# Patient Record
Sex: Female | Born: 1995 | Race: Black or African American | Hispanic: No | Marital: Married | Smoking: Former smoker
Health system: Southern US, Community
[De-identification: ages and names within clinical notes are randomized; demographics above are authoritative.]

## PROBLEM LIST (undated history)

## (undated) DIAGNOSIS — J302 Other seasonal allergic rhinitis: Secondary | ICD-10-CM

## (undated) DIAGNOSIS — L309 Dermatitis, unspecified: Secondary | ICD-10-CM

## (undated) DIAGNOSIS — F32A Depression, unspecified: Secondary | ICD-10-CM

## (undated) DIAGNOSIS — F419 Anxiety disorder, unspecified: Secondary | ICD-10-CM

## (undated) DIAGNOSIS — F329 Major depressive disorder, single episode, unspecified: Secondary | ICD-10-CM

## (undated) DIAGNOSIS — A6000 Herpesviral infection of urogenital system, unspecified: Secondary | ICD-10-CM

## (undated) DIAGNOSIS — D649 Anemia, unspecified: Secondary | ICD-10-CM

## (undated) HISTORY — PX: NO PAST SURGERIES: SHX2092

## (undated) NOTE — *Deleted (*Deleted)
History     CSN: [id number]  Arrival date and time: 07/31/20 1803   First Provider Initiated Contact with Patient 07/31/20 1938      Chief Complaint  Patient presents with  . Contractions  . Dizziness   24 y.o. G1 @29 .6 wks presenting with episode of dizzyness and lightheadedness. Episode happened this afternoon when she was sitting doing work from home. She had something to eat and drink but didn't feel better then began having sharp LAP. Pain was coming q3 min. Denies LOF. Reports some pink spotting when she wipes. No recent IC. She is currently treating an HSV outbreak. No urinary sx. Reports good FM.   OB History    Gravida  1   Para  0   Term  0   Preterm  0   AB  0   Living  0     SAB  0   TAB  0   Ectopic  0   Multiple  0   Live Births              Past Medical History:  Diagnosis Date  . Anemia   . Anxiety   . Asthma   . Depression    doing ok  . Eczema   . Genital herpes   . Seasonal allergies     Past Surgical History:  Procedure Laterality Date  . NO PAST SURGERIES      Family History  Adopted: Yes  Problem Relation Age of Onset  . Hypertension Father   . Hypertension Mother     Social History   Tobacco Use  . Smoking status: Former Smoker    Types: Cigars  . Smokeless tobacco: Never Used  . Tobacco comment: April 2017  Vaping Use  . Vaping Use: Never used  Substance Use Topics  . Alcohol use: Yes    Comment: Social   . Drug use: No    Allergies:  Allergies  Allergen Reactions  . Dust Mite Extract Itching  . Latex Swelling    Medications Prior to Admission  Medication Sig Dispense Refill Last Dose  . diphenhydrAMINE (BENADRYL) 25 mg capsule Take 25 mg by mouth 2 (two) times daily as needed for allergies.   07/30/2020 at Unknown time  . ferrous sulfate 325 (65 FE) MG tablet Take 325 mg by mouth daily with breakfast.   07/31/2020 at Unknown time  . valACYclovir (VALTREX) 1000 MG tablet Take 1,000 mg by mouth  daily.       Review of Systems  Gastrointestinal: Positive for abdominal pain.  Genitourinary: Positive for urgency and vaginal bleeding. Negative for dysuria, frequency and vaginal discharge.  Musculoskeletal: Positive for back pain.   Physical Exam   Blood pressure 113/74, pulse (!) 145, resp. rate 20, last menstrual period 01/04/2020, SpO2 96 %.  Physical Exam Vitals and nursing note reviewed. Exam conducted with a chaperone present.  Constitutional:      Appearance: Normal appearance.  HENT:     Head: Normocephalic and atraumatic.  Pulmonary:     Effort: Pulmonary effort is normal. No respiratory distress.  Musculoskeletal:        General: Normal range of motion.     Cervical back: Normal and normal range of motion.     Thoracic back: Normal.     Lumbar back: Bony tenderness present.  Skin:    General: Skin is warm and dry.  Neurological:     General: No focal deficit present.     Mental  Status: She is alert and oriented to person, place, and time.  Psychiatric:        Mood and Affect: Mood normal.        Behavior: Behavior normal.   EFM: 155 bpm, mod variability, + accels, no decels Toco: none  No results found for this or any previous visit (from the past 24 hour(s)).  MAU Course  Procedures  MDM Labs ordered. Transfer of care given to Lorenda Peck, [place]  07/31/2020 8:04 PM    Assessment and Plan

---
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
## 2014-01-05 ENCOUNTER — Encounter (HOSPITAL_COMMUNITY): Payer: Self-pay | Admitting: Emergency Medicine

## 2014-01-05 ENCOUNTER — Emergency Department (HOSPITAL_COMMUNITY)
Admission: EM | Admit: 2014-01-05 | Discharge: 2014-01-05 | Disposition: A | Discharge status: Home / Self Care | Payer: No Typology Code available for payment source | Attending: Emergency Medicine | Admitting: Emergency Medicine

## 2014-01-05 DIAGNOSIS — Z8659 Personal history of other mental and behavioral disorders: Secondary | ICD-10-CM | POA: Insufficient documentation

## 2014-01-05 DIAGNOSIS — Z79899 Other long term (current) drug therapy: Secondary | ICD-10-CM | POA: Insufficient documentation

## 2014-01-05 DIAGNOSIS — Z3202 Encounter for pregnancy test, result negative: Secondary | ICD-10-CM | POA: Insufficient documentation

## 2014-01-05 DIAGNOSIS — J45909 Unspecified asthma, uncomplicated: Secondary | ICD-10-CM | POA: Insufficient documentation

## 2014-01-05 DIAGNOSIS — IMO0002 Reserved for concepts with insufficient information to code with codable children: Secondary | ICD-10-CM

## 2014-01-05 DIAGNOSIS — T7421XA Adult sexual abuse, confirmed, initial encounter: Secondary | ICD-10-CM | POA: Insufficient documentation

## 2014-01-05 DIAGNOSIS — Z862 Personal history of diseases of the blood and blood-forming organs and certain disorders involving the immune mechanism: Secondary | ICD-10-CM | POA: Insufficient documentation

## 2014-01-05 MED ORDER — LEVONORGESTREL 0.75 MG PO TABS
ORAL_TABLET | ORAL | Status: AC
  Administered 2014-01-05: 1.5 mg via ORAL
  Filled 2014-01-05: qty 2

## 2014-01-05 MED ORDER — CEFIXIME 400 MG PO TABS
ORAL_TABLET | ORAL | Status: AC
  Administered 2014-01-05: 400 mg via ORAL
  Filled 2014-01-05: qty 1

## 2014-01-05 MED ORDER — METRONIDAZOLE 500 MG PO TABS
ORAL_TABLET | ORAL | Status: AC
  Administered 2014-01-05: 2000 mg via ORAL
  Filled 2014-01-05: qty 4

## 2014-01-05 MED ORDER — AZITHROMYCIN 1 G PO PACK
PACK | ORAL | Status: AC
  Administered 2014-01-05: 1 g via ORAL
  Filled 2014-01-05: qty 1

## 2014-01-05 MED ORDER — PROMETHAZINE HCL 25 MG PO TABS
ORAL_TABLET | ORAL | Status: AC
  Administered 2014-01-05: 25 mg via ORAL
  Filled 2014-01-05: qty 3
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
## 2014-01-05 NOTE — ED Notes (Signed)
Bed: WA29 Expected date:  Expected time:  Means of arrival:  Comments: TCU 

## 2014-01-05 NOTE — ED Notes (Signed)
Sane nurses at bedside.
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
## 2014-01-05 NOTE — Discharge Instructions (Signed)
° °Sexual Assault, Rape  °Sexual assault can be physical, verbal, visual, or anything that forces a person to have unwanted sexual contact. You are being sexually abused if you are forced to have sexual contact of any kind (vaginal, oral, or anal). Sexual assault is called rape if penetration has occurred. Sexual assault and rape are never the victim's fault.  °The physical dangers of rape include pregnancy, injury, and catching a sexually transmitted infection (STI). Your caregiver or emergency room doctor may recommend a number of tests to be done after a sexual assault or rape. A rape kit will collect evidence and check for infection and injury. You may be treated for an infection even if no signs of one are present. This may also be true if tests and cultures for disease are negative. Emergency contraceptive medications are also available to help prevent pregnancy, if this is desired. All of these options can be discussed with your caregiver. A sexual assault is a traumatic event. Counseling is available.  °STEPS TO TAKE IF A SEXUAL ASSAULT HAS HAPPENED  °Go to an area of safety as quickly as possible and call the police. This may include going to a shelter or staying with a friend. Stay away from the area where you have been attacked. Many times, sexual assaults are caused by a friend, relative, or associate.  °Do not wash, shower, comb your hair, or clean any part of your body.  °Do not change your clothes.  °Do not remove or touch anything in the area where you were assaulted.  °Go to an emergency room or your caregiver for a complete physical exam. Get the necessary tests to protect yourself from disease or pregnancy.  °If medications were given by your caregiver, take them as directed for the full length of time prescribed. If you have come in contact with a sexual infection, find out if you are to be tested again. If your caregiver is concerned about the HIV/AIDS virus, you may be required to have  continued testing for several months. Make sure you know how to get test results. It is your responsibility to get the results of all testing done.  °File the correct papers with the authorities. This is important for all assaults, even if they were done by a family member orfriend.  °Only take over-the-counter or prescription medicines for pain, discomfort, or fever as directed by your caregiver. °HOME CARE INSTRUCTIONS  °Carry mace or pepper spray for protection against an attacker.  °Do not try to fight off an attacker if he or she has a gun or knife.  °Be aware of your surroundings, what is happening around you, and who might be there.  °Be assertive, trust your instincts, and walk with confidence and direction.  °Always lock your doors and windows.  °Do not let anyone who you do not know enter your house.  °Get door safety restraints and always use them.  °Get a security system that has a siren if you are able.  °Protect your house and car keys. Do not lend them out. Do not put your name and address on them. If you lose them, get your locks changed.  °Always lock your car and have your key ready to open the door before approaching the car.  °Park in a well lit and busy area.  °Keep your car serviced. Always have at least half of a tank of gas in it.  ° °POSSIBLE TREATMENT: °·  You may have received oral or injectable   to help prevent sexually transmitted infections.  Hepatitis B vaccine- Immunization should be given if not already immunized.  This is a series of three injections, so any further injections should be obtained by your primary care physician.  HPV (Human Papilloma Virus) vaccine (Gardisil)- Immunization should be given, if not immunized previously or not up-to-date.  HIV (Human Immunodeficiency Virus)- Your caregiver will help you decide whether to begin the prophylactic medications, based on your circumstances.    Tetanus Immunization- This also may be recommended based on your  circumstances.  Pregnancy Prevention-  Also called "emergency contraception."  This will be offered to you if the situation indicates.  SUGGESTED FOLLOW-UP CARE:  Please follow-up with your medical care provider or where you/your child were referred (health department, women's clinic, pediatrician, etc) IN TEN DAYS TO TWO WEEKS.  We recommend the following during that visit, if indicated:  Pregnancy test  HIV, syphyllis, and Hepatitis blood testing  Cultures for sexually transmitted infections   Drive on lighted and frequently traveled streets.  Do not go into isolated areas like open garages, empty offices, buildings, or laundry rooms alone.  Do not walk or jog alone, especially when it is dark.  Never hitchhike!  If your car breaks down, call the police for help on your cell phone, put the hood of your car up, and a put a "HELP" sign on your front and back windows.  If you are being followed, go to a busy store or to someone's house and call for help.  If you are stopped by a police officer, especially in an unmarked police car, keep your door locked. Do not put your window down all the way. Ask them to show you identification first.  Beware of "date rape drugs" that can be placed in a drink when you are not looking and can make you unable to fight off an assault. They usually cause memory loss. If you know someone who has been sexually abused or raped, take them to a hospital or to the police or call your local emergency services for help.  SEEK MEDICAL CARE IF:  You have new problems because of your injuries.  You have problems that may be because of the medicine you are taking. These problems may include rash, itching, swelling, or trouble breathing.  You develop belly (abdominal) pain, you feel sick to your stomach (nausea), or you vomit.  You have an oral temperature above 102 F (38.9 C).  You need supportive care or referral to a rape crisis center. These are centers with  trained personnel who can help you get through this ordeal.  You have abnormal vaginal bleeding.  You have abnormal vaginal discharge. SEEK IMMEDIATE MEDICAL CARE IF:  You have been sexually assaulted or raped. Call your local emergency department (911 in the [place] for help.  You are afraid of being threatened, beaten, or abused. Call your local emergency department (911 in the [place] for help.  You receive new injuries related to abuse.  You have an oral temperature above 102 F (38.9 C), not controlled by medicine. For more information on sexual assault and rape call the National Women's Health Information Center at 1-800-994-9662.  Document Released: 08/27/2000 Document Revised: 11/22/2011 Document Reviewed: 10/10/2008  ExitCare Patient Information 2014 [employer].   
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
## 2014-01-07 LAB — POC URINE PREG, ED: PREG TEST UR: NEGATIVE

## 2014-01-26 ENCOUNTER — Encounter: Payer: Self-pay | Admitting: *Deleted

## 2014-02-07 ENCOUNTER — Encounter: Payer: Medicaid Other | Admitting: Family

## 2014-06-29 ENCOUNTER — Encounter (HOSPITAL_COMMUNITY): Payer: Self-pay | Admitting: Emergency Medicine

## 2014-06-29 ENCOUNTER — Inpatient Hospital Stay (HOSPITAL_COMMUNITY)
Admission: EM | Admit: 2014-06-29 | Discharge: 2014-07-03 | DRG: 812 | Disposition: A | Discharge status: Home / Self Care | Payer: Medicaid Other | Attending: Obstetrics and Gynecology | Admitting: Obstetrics and Gynecology

## 2014-06-29 ENCOUNTER — Emergency Department (HOSPITAL_COMMUNITY): Payer: Medicaid Other

## 2014-06-29 DIAGNOSIS — D500 Iron deficiency anemia secondary to blood loss (chronic): Principal | ICD-10-CM | POA: Diagnosis present

## 2014-06-29 DIAGNOSIS — N938 Other specified abnormal uterine and vaginal bleeding: Secondary | ICD-10-CM | POA: Diagnosis present

## 2014-06-29 DIAGNOSIS — R102 Pelvic and perineal pain: Secondary | ICD-10-CM

## 2014-06-29 DIAGNOSIS — R7401 Elevation of levels of liver transaminase levels: Secondary | ICD-10-CM

## 2014-06-29 DIAGNOSIS — N939 Abnormal uterine and vaginal bleeding, unspecified: Secondary | ICD-10-CM

## 2014-06-29 DIAGNOSIS — D649 Anemia, unspecified: Secondary | ICD-10-CM | POA: Diagnosis present

## 2014-06-29 DIAGNOSIS — R740 Nonspecific elevation of levels of transaminase and lactic acid dehydrogenase [LDH]: Secondary | ICD-10-CM

## 2014-06-29 DIAGNOSIS — B009 Herpesviral infection, unspecified: Secondary | ICD-10-CM

## 2014-06-29 DIAGNOSIS — A6000 Herpesviral infection of urogenital system, unspecified: Secondary | ICD-10-CM | POA: Diagnosis present

## 2014-06-29 LAB — CBC WITH DIFFERENTIAL/PLATELET
BASOS ABS: 0.1 10*3/uL (ref 0.0–0.1)
Basophils Relative: 1 % (ref 0–1)
Eosinophils Absolute: 0 10*3/uL (ref 0.0–0.7)
Eosinophils Relative: 0 % (ref 0–5)
HCT: 22.6 % — ABNORMAL LOW (ref 36.0–46.0)
Hemoglobin: 6.7 g/dL — CL (ref 12.0–15.0)
LYMPHS ABS: 2.1 10*3/uL (ref 0.7–4.0)
Lymphocytes Relative: 30 % (ref 12–46)
MCH: 19.6 pg — AB (ref 26.0–34.0)
MCHC: 29.6 g/dL — ABNORMAL LOW (ref 30.0–36.0)
MCV: 66.3 fL — AB (ref 78.0–100.0)
MONOS PCT: 16 % — AB (ref 3–12)
Monocytes Absolute: 1.1 10*3/uL — ABNORMAL HIGH (ref 0.1–1.0)
NEUTROS ABS: 3.8 10*3/uL (ref 1.7–7.7)
Neutrophils Relative %: 53 % (ref 43–77)
Platelets: 161 10*3/uL (ref 150–400)
RBC: 3.41 MIL/uL — ABNORMAL LOW (ref 3.87–5.11)
RDW: 18.2 % — AB (ref 11.5–15.5)
WBC: 7.1 10*3/uL (ref 4.0–10.5)

## 2014-06-29 LAB — COMPREHENSIVE METABOLIC PANEL
ALBUMIN: 2.9 g/dL — AB (ref 3.5–5.2)
ALT: 16 U/L (ref 0–35)
ANION GAP: 12 (ref 5–15)
AST: 69 U/L — ABNORMAL HIGH (ref 0–37)
Alkaline Phosphatase: 37 U/L — ABNORMAL LOW (ref 39–117)
BILIRUBIN TOTAL: 0.3 mg/dL (ref 0.3–1.2)
BUN: 7 mg/dL (ref 6–23)
CALCIUM: 7.8 mg/dL — AB (ref 8.4–10.5)
CHLORIDE: 103 meq/L (ref 96–112)
CO2: 24 mEq/L (ref 19–32)
CREATININE: 0.78 mg/dL (ref 0.50–1.10)
GFR calc Af Amer: >90 mL/min (ref >90)
GFR calc non Af Amer: >90 mL/min (ref >90)
Glucose, Bld: 86 mg/dL (ref 70–99)
Potassium: 3 mEq/L — ABNORMAL LOW (ref 3.7–5.3)
Sodium: 139 mEq/L (ref 137–147)
Total Protein: 6.8 g/dL (ref 6.0–8.3)

## 2014-06-29 LAB — URINALYSIS, ROUTINE W REFLEX MICROSCOPIC
BILIRUBIN URINE: NEGATIVE
Glucose, UA: NEGATIVE mg/dL
Ketones, ur: NEGATIVE mg/dL
Nitrite: NEGATIVE
Protein, ur: NEGATIVE mg/dL
SPECIFIC GRAVITY, URINE: 1.004 — AB (ref 1.005–1.030)
UROBILINOGEN UA: 1 mg/dL (ref 0.0–1.0)
pH: 6.5 (ref 5.0–8.0)

## 2014-06-29 LAB — TYPE AND SCREEN
ABO/RH(D): O POS
Antibody Screen: NEGATIVE

## 2014-06-29 LAB — URINE MICROSCOPIC-ADD ON

## 2014-06-29 LAB — WET PREP, GENITAL
Clue Cells Wet Prep HPF POC: NONE SEEN
Trich, Wet Prep: NONE SEEN
Yeast Wet Prep HPF POC: NONE SEEN

## 2014-06-29 LAB — RETICULOCYTES: RBC.: 3.39 MIL/uL — AB (ref 3.87–5.11)

## 2014-06-29 LAB — POC URINE PREG, ED: Preg Test, Ur: NEGATIVE

## 2014-06-29 MED ORDER — POTASSIUM CHLORIDE 10 MEQ/100ML IV SOLN
10.0000 meq | Freq: Once | INTRAVENOUS | Status: AC
  Administered 2014-06-29: 10 meq via INTRAVENOUS
  Filled 2014-06-29: qty 100

## 2014-06-29 MED ORDER — ONDANSETRON HCL 4 MG/2ML IJ SOLN
4.0000 mg | Freq: Once | INTRAMUSCULAR | Status: AC
  Administered 2014-06-29: 4 mg via INTRAVENOUS
  Filled 2014-06-29: qty 2

## 2014-06-29 MED ORDER — DEXTROSE 5 % IV SOLN: 1.0000 g | Freq: Once | INTRAVENOUS | Status: DC

## 2014-06-29 MED ORDER — SODIUM CHLORIDE 0.9 % IV BOLUS (SEPSIS)
1000.0000 mL | Freq: Once | INTRAVENOUS | Status: AC
  Administered 2014-06-29: 1000 mL via INTRAVENOUS

## 2014-06-29 MED ORDER — MORPHINE SULFATE 4 MG/ML IJ SOLN
4.0000 mg | Freq: Once | INTRAMUSCULAR | Status: AC
  Administered 2014-06-29: 4 mg via INTRAVENOUS
  Filled 2014-06-29: qty 1

## 2014-06-29 NOTE — ED Notes (Signed)
US at bedside

## 2014-06-29 NOTE — ED Provider Notes (Signed)
Medical screening examination/treatment/procedure(s) were conducted as a shared visit with non-physician practitioner(s) and myself.  I personally evaluated the patient during the encounter.   EKG Interpretation None     The patient reports that she has vaginal discomfort. She has been noting blood in her urine. Patient denies that she's had any lightheadedness dizziness or near syncope  Patient is alert and nontoxic. Her abdomen soft nontender.  At this time patient is found to be significantly anemic and plan is in place for transfer to the [facility] hospital for transfusion and further evaluation. I am in agreement with the plan.  Charlesetta Shanks, MD 06/29/14 2149

## 2014-06-29 NOTE — ED Provider Notes (Signed)
CSN: [id number]     Arrival date & time 06/29/14  1721 History   First MD Initiated Contact with Patient 06/29/14 1758     Chief Complaint  Patient presents with  . Groin Swelling  . Vaginal Discharge     (Consider location/radiation/quality/duration/timing/severity/associated sxs/prior Treatment) HPI Comments: Brandy Howard is a 18 y.o. G0P0 female with a PMHx of asthma, iron deficiency anemia, anxiety, and seasonal allergies, who presents to the ED with complaints of vaginal bleeding/urethral bleeding (pt unsure if blood coming from vagina or only from urine) and vaginal pain with labial swelling/lesions x4 days. Pt states on Monday she developed flu-like symptoms, including decreased appetite, nausea with food although no emesis and tolerating fluids, HA, body aches and generalized fatigue and malaise. Tuesday she developed labial swelling with "flat white patches". Pain is 7/10 constant pressure-like located in the labia, nonradiating, worse with urination, and unimproved with motrin. On Thursday she developed dysuria and hematuria stating she began passing "large clots" only in her urine and denies that this was coming from her vagina. She states that on Tuesday during her shower she had a syncopal episode, but did not have any head injury. Has not had any further syncopal episodes since. She endorses white vaginal discharge that smells of blood. Denies fevers, chills, ongoing dizziness, lightheadedness, CP, SOB, URI symptoms, cough, abd pain, vomiting, diarrhea, constipation, dyspareunia, vaginal itching, new soaps or detergents, arthralgias, ongoing syncopal episodes, weakness, or any other concerns. States she's sexually active with 8 sexual partners this year, last intercourse was one week ago and was unprotected, and states it was "rough". LMP 06/08/14 States she has regular periods but they are 5 days long and heavy. Was told she needed to take iron for anemia 2 yrs ago and never did.    Patient is a 18 y.o. female presenting with vaginal bleeding. The history is provided by the patient. No language interpreter was used.  Vaginal Bleeding Quality:  Clots and dark red Severity:  Moderate Onset quality:  Gradual Duration:  4 days Timing:  Constant Progression:  Unchanged Chronicity:  New Menstrual history:  Regular Possible pregnancy: no   Context: after urination   Relieved by:  None tried Worsened by:  Urination Ineffective treatments:  Ibuprofen Associated symptoms: dizziness, dysuria, fatigue, nausea and vaginal discharge   Associated symptoms: no abdominal pain, no back pain, no dyspareunia and no fever   Vaginal discharge:    Quality:  White   Severity:  Moderate   Onset quality:  Gradual   Duration:  4 days   Timing:  Constant   Progression:  Unchanged   Chronicity:  New Risk factors: unprotected sex   Risk factors: no bleeding disorder, no hx of ectopic pregnancy and no gynecological surgery     Past Medical History  Diagnosis Date  . Asthma   . Anemia   . Anxiety   . Seasonal allergies    History reviewed. No pertinent past surgical history. History reviewed. No pertinent family history. History  Substance Use Topics  . Smoking status: Never Smoker   . Smokeless tobacco: Never Used  . Alcohol Use: No   OB History   Grav Para Term Preterm Abortions TAB SAB Ect Mult Living                 Review of Systems  Constitutional: Positive for appetite change (decreased) and fatigue. Negative for fever and chills.  HENT: Negative for congestion, rhinorrhea and sore throat.   Eyes: Negative  for photophobia, pain, redness and visual disturbance.  Respiratory: Negative for cough and shortness of breath.   Cardiovascular: Negative for chest pain.  Gastrointestinal: Positive for nausea. Negative for vomiting, abdominal pain, diarrhea, constipation, blood in stool, abdominal distention and rectal pain.  Genitourinary: Positive for dysuria,  hematuria, vaginal bleeding, vaginal discharge, vaginal pain and pelvic pain. Negative for urgency, flank pain, decreased urine volume, difficulty urinating and dyspareunia.  Musculoskeletal: Positive for myalgias (generalized body aches). Negative for arthralgias and back pain.  Skin: Negative for rash.  Allergic/Immunologic: Negative for immunocompromised state.  Neurological: Positive for dizziness. Negative for syncope, weakness, light-headedness, numbness and headaches.  Hematological: Negative for adenopathy.  Psychiatric/Behavioral: Negative for confusion.    10 Systems reviewed and are negative for acute change except as noted in the HPI.   Allergies  Dust mite extract  Home Medications   Prior to Admission medications   Not on File   BP 115/68  Pulse 100  Temp(Src) 98.7 F (37.1 C) (Oral)  Resp 18  SpO2 100%  LMP 06/08/2014 Physical Exam  Nursing note and vitals reviewed. Constitutional: She is oriented to person, place, and time. Vital signs are normal. She appears well-developed and well-nourished. No distress.  Afebrile, nontoxic, NAD, talking on her cell phone, appears somewhat uncomfortable when sitting up but otherwise NAD  HENT:  Head: Normocephalic and atraumatic.  Mouth/Throat: Mucous membranes are normal.  Eyes: Conjunctivae and EOM are normal. Pupils are equal, round, and reactive to light. Right eye exhibits no discharge. Left eye exhibits no discharge.  Conjunctival pallor  Neck: Normal range of motion. Neck supple.  Cardiovascular: Normal rate, regular rhythm, normal heart sounds and intact distal pulses.  Exam reveals no gallop and no friction rub.   No murmur heard. RRR, nl s1/s2, no m/r/g  Pulmonary/Chest: Effort normal and breath sounds normal. No respiratory distress. She has no decreased breath sounds. She has no wheezes. She has no rhonchi. She has no rales.  Abdominal: Soft. Normal appearance and bowel sounds are normal. She exhibits no  distension. There is no tenderness. There is no rigidity, no rebound, no guarding, no CVA tenderness, no tenderness at McBurney's point and negative Stainback's sign.  Soft, NTND, +BS throughout, no r/g/r, neg murphys, neg mcburney's, no CVA TTP  Genitourinary: Pelvic exam was performed with patient supine. There is rash, tenderness and lesion on the right labia. There is no injury on the right labia. There is rash, tenderness and lesion on the left labia. There is no injury on the left labia. Uterus is tender. Uterus is not deviated, not enlarged and not fixed. Cervix exhibits motion tenderness and discharge (bloody). Cervix exhibits no friability. Right adnexum displays tenderness. Right adnexum displays no mass and no fullness. Left adnexum displays tenderness. Left adnexum displays no mass and no fullness. There is bleeding around the vagina. No erythema or tenderness around the vagina. No foreign body around the vagina. No signs of injury around the vagina. No vaginal discharge found.  Diffuse erythematous somewhat vesicular rash of bilateral labia with tenderness of lesions, no bartholin's gland abscesses. No erythema, injury, or tenderness to vaginal mucosa. Moderate amount of dark red blood within vaginal vault coming from cervical os which is closed. No adnexal masses or fullness but diffusely tender in b/l adnexa and to uterus. +CMT but difficult to assess whether this is just tenderness of the uterus or true CMT. No cervical friability or discharge from cervical os, but dark red blood coming from closed os. Uterus  non-deviated, mobile, and without enlargement, with diffuse tenderness. Urethra without trauma or lesions, no erythema or bleeding.  Musculoskeletal: Normal range of motion.  Neurological: She is alert and oriented to person, place, and time. She has normal strength. No sensory deficit. Gait normal.  Skin: Skin is warm, dry and intact. No rash noted.  Psychiatric: She has a normal mood and  affect.    ED Course  Procedures (including critical care time) Labs Review Labs Reviewed  WET PREP, GENITAL - Abnormal; Notable for the following:    WBC, Wet Prep HPF POC FEW (*)    All other components within normal limits  CBC WITH DIFFERENTIAL - Abnormal; Notable for the following:    RBC 3.41 (*)    Hemoglobin 6.7 (*)    HCT 22.6 (*)    MCV 66.3 (*)    MCH 19.6 (*)    MCHC 29.6 (*)    RDW 18.2 (*)    Monocytes Relative 16 (*)    Monocytes Absolute 1.1 (*)    All other components within normal limits  COMPREHENSIVE METABOLIC PANEL - Abnormal; Notable for the following:    Potassium 3.0 (*)    Calcium 7.8 (*)    Albumin 2.9 (*)    AST 69 (*)    Alkaline Phosphatase 37 (*)    All other components within normal limits  RETICULOCYTES - Abnormal; Notable for the following:    Retic Ct Pct <0.4 (*)    RBC. 3.39 (*)    All other components within normal limits  GC/CHLAMYDIA PROBE AMP  URINALYSIS, ROUTINE W REFLEX MICROSCOPIC  RPR  HIV ANTIBODY (ROUTINE TESTING)  VITAMIN B12  FOLATE  IRON AND TIBC  FERRITIN  POC URINE PREG, ED  TYPE AND SCREEN  ABO/RH    Imaging Review US Transvaginal Non-ob  06/29/2014   CLINICAL DATA:  Vaginal bleeding. Tender uterus and adnexa bilaterally.  EXAM: TRANSABDOMINAL AND TRANSVAGINAL ULTRASOUND OF PELVIS  DOPPLER ULTRASOUND OF OVARIES  TECHNIQUE: Both transabdominal and transvaginal ultrasound examinations of the pelvis were performed. Transabdominal technique was performed for global imaging of the pelvis including uterus, ovaries, adnexal regions, and pelvic cul-de-sac.  It was necessary to proceed with endovaginal exam following the transabdominal exam to visualize the ovaries. Color and duplex Doppler ultrasound was utilized to evaluate blood flow to the ovaries.  COMPARISON:  None.  FINDINGS: Uterus  Measurements: 6.5 x 3.7 x 3.3 cm. No fibroids or other mass visualized.  Endometrium  Thickness: 7.5 mm.  No focal abnormality visualized.   Right ovary  Measurements: 3.1 x 2.8 x 2.3 cm. Normal appearance/no adnexal mass.  Left ovary  Measurements: 2.8 x 1.5 x 1.2 cm. Normal appearance/no adnexal mass.  Pulsed Doppler evaluation of both ovaries demonstrates normal low-resistance arterial and venous waveforms.  Other findings  Trace amount of free peritoneal fluid, within normal limits of physiological fluid.  IMPRESSION: Normal examination.   Electronically Signed   By: Enrique Sack M.D.   On: 06/29/2014 20:51   US Pelvis Complete  06/29/2014   CLINICAL DATA:  Vaginal bleeding. Tender uterus and adnexa bilaterally.  EXAM: TRANSABDOMINAL AND TRANSVAGINAL ULTRASOUND OF PELVIS  DOPPLER ULTRASOUND OF OVARIES  TECHNIQUE: Both transabdominal and transvaginal ultrasound examinations of the pelvis were performed. Transabdominal technique was performed for global imaging of the pelvis including uterus, ovaries, adnexal regions, and pelvic cul-de-sac.  It was necessary to proceed with endovaginal exam following the transabdominal exam to visualize the ovaries. Color and duplex Doppler ultrasound was  utilized to evaluate blood flow to the ovaries.  COMPARISON:  None.  FINDINGS: Uterus  Measurements: 6.5 x 3.7 x 3.3 cm. No fibroids or other mass visualized.  Endometrium  Thickness: 7.5 mm.  No focal abnormality visualized.  Right ovary  Measurements: 3.1 x 2.8 x 2.3 cm. Normal appearance/no adnexal mass.  Left ovary  Measurements: 2.8 x 1.5 x 1.2 cm. Normal appearance/no adnexal mass.  Pulsed Doppler evaluation of both ovaries demonstrates normal low-resistance arterial and venous waveforms.  Other findings  Trace amount of free peritoneal fluid, within normal limits of physiological fluid.  IMPRESSION: Normal examination.   Electronically Signed   By: Enrique Sack M.D.   On: 06/29/2014 20:51   [place] Art/ven Flow Abd Pelv Doppler  06/29/2014   CLINICAL DATA:  Vaginal bleeding. Tender uterus and adnexa bilaterally.  EXAM: TRANSABDOMINAL AND TRANSVAGINAL  ULTRASOUND OF PELVIS  DOPPLER ULTRASOUND OF OVARIES  TECHNIQUE: Both transabdominal and transvaginal ultrasound examinations of the pelvis were performed. Transabdominal technique was performed for global imaging of the pelvis including uterus, ovaries, adnexal regions, and pelvic cul-de-sac.  It was necessary to proceed with endovaginal exam following the transabdominal exam to visualize the ovaries. Color and duplex Doppler ultrasound was utilized to evaluate blood flow to the ovaries.  COMPARISON:  None.  FINDINGS: Uterus  Measurements: 6.5 x 3.7 x 3.3 cm. No fibroids or other mass visualized.  Endometrium  Thickness: 7.5 mm.  No focal abnormality visualized.  Right ovary  Measurements: 3.1 x 2.8 x 2.3 cm. Normal appearance/no adnexal mass.  Left ovary  Measurements: 2.8 x 1.5 x 1.2 cm. Normal appearance/no adnexal mass.  Pulsed Doppler evaluation of both ovaries demonstrates normal low-resistance arterial and venous waveforms.  Other findings  Trace amount of free peritoneal fluid, within normal limits of physiological fluid.  IMPRESSION: Normal examination.   Electronically Signed   By: Enrique Sack M.D.   On: 06/29/2014 20:51   US Abdomen Limited Ruq  06/29/2014   CLINICAL DATA:  Elevated liver enzymes  EXAM: US ABDOMEN LIMITED - RIGHT UPPER QUADRANT  COMPARISON:  None.  FINDINGS: Gallbladder:  No gallstones or wall thickening visualized. No sonographic Molnar sign noted.  Common bile duct:  Diameter: 4 mm  Liver:  No focal lesion identified. Within normal limits in parenchymal echogenicity.  IMPRESSION: Normal right upper quadrant ultrasound   Electronically Signed   By: Skipper Cliche M.D.   On: 06/29/2014 23:01     EKG Interpretation None      MDM   Final diagnoses:  Transaminitis  Vaginal bleeding  Iron deficiency anemia due to chronic blood loss    18y/o female with vaginal pain and lesions as well as bleeding that she states is from her urine but during pelvic, large amount of dark  red blood in vaginal vault and no urethral trauma or injury noted. Diffusely tender during exam, although this could just be from uterine bleeding, some concern for PID or TOA or torsion vs other pelvic etiology. Will obtain labs, give pain meds and obtain u/s. Will reassess shortly. Unclear if pt is pregnant, LMP less than one month ago, unable to obtain urine thus far, will I&O and hope to get upreg shortly.  8:35 PM Critical low H/H, appears to be iron deficiency anemia, ordered type and screen and anemia panel. Still awaiting Upreg, ultrasound in performing exam on her currently. Plan to consult for admission for transfusion. CMP with mildly elev AST and alk phos, likely related to dehydration/anemia, doubt  hepatic/biliary etiology. Wet prep showing few WBC, doubt need for empiric tx for PID or GC/CT, given that pt needs admission for anemia will defer for now on treatment. No vaginal discharge noted on exam, and other than bleeding no concern for infection. Hypokalemia noted, will plan to treat once admission consult placed, once we have a clear idea if she's pregnant or not.   9:31 PM Dr. Maudie Mercury returning page, would like OBGYN consulted. Would also like RUQ ultrasound obtained. Called OBGYN who agreed to accept pt, will begin transfer now.  11:13 PM Pt has bed assignment at [facility], Room 304, and to be transferred via [facility]. Given delays, started potassium now. U/A resulted, showed 3-6 WBC and small leuks, will give IV rocephin for possible UTI while pt awaiting transfer. Will culture. Would probably do fine on PO abx as outpt for UTI, but pt awaiting transfer therefore will give IV dose here. RUQ ultrasound performed and negative for any abnormalities. Pt remains stable at this time.  Patty Sermons [place], [place] 06/30/14 0120

## 2014-06-29 NOTE — ED Notes (Signed)
In and out cath attempted from urine, unable to obtain urine sample at this time because pt urinated in bathroom prior to in and out cath.

## 2014-06-29 NOTE — ED Notes (Signed)
Bloody UA sample

## 2014-06-29 NOTE — ED Notes (Signed)
Pt states she just went to bathroom, requests more fluids before doing I&O cath

## 2014-06-29 NOTE — ED Notes (Signed)
Pt states that she began having vaginal swelling and discharge after 2 rough sexual encounters over the past week.  (One last Saturday and one the Saturday before).  States that her labia majora are swollen and that she has "flat, white patches" and vaginal discharge.  Also states that she has been having dysuria and bloody urine.

## 2014-06-30 ENCOUNTER — Encounter (HOSPITAL_COMMUNITY): Payer: Self-pay | Admitting: *Deleted

## 2014-06-30 DIAGNOSIS — N9089 Other specified noninflammatory disorders of vulva and perineum: Secondary | ICD-10-CM | POA: Diagnosis present

## 2014-06-30 DIAGNOSIS — A6000 Herpesviral infection of urogenital system, unspecified: Secondary | ICD-10-CM | POA: Diagnosis present

## 2014-06-30 DIAGNOSIS — D500 Iron deficiency anemia secondary to blood loss (chronic): Secondary | ICD-10-CM | POA: Diagnosis present

## 2014-06-30 DIAGNOSIS — D649 Anemia, unspecified: Secondary | ICD-10-CM | POA: Diagnosis present

## 2014-06-30 DIAGNOSIS — N938 Other specified abnormal uterine and vaginal bleeding: Secondary | ICD-10-CM | POA: Diagnosis present

## 2014-06-30 LAB — ABO/RH
ABO/RH(D): O POS
ABO/RH(D): O POS

## 2014-06-30 LAB — FOLATE

## 2014-06-30 LAB — IRON AND TIBC
Iron: 10 ug/dL — ABNORMAL LOW (ref 42–135)
Saturation Ratios: 3 % — ABNORMAL LOW (ref 20–55)
TIBC: 335 ug/dL (ref 250–470)
UIBC: 325 ug/dL (ref 125–400)

## 2014-06-30 LAB — FERRITIN: Ferritin: 32 ng/mL (ref 10–291)

## 2014-06-30 LAB — PREPARE RBC (CROSSMATCH)

## 2014-06-30 LAB — HIV ANTIBODY (ROUTINE TESTING W REFLEX): HIV: NONREACTIVE

## 2014-06-30 LAB — VITAMIN B12: VITAMIN B 12: 615 pg/mL (ref 211–911)

## 2014-06-30 LAB — RPR

## 2014-06-30 MED ORDER — ACETAMINOPHEN 325 MG PO TABS
650.0000 mg | ORAL_TABLET | Freq: Once | ORAL | Status: AC
  Administered 2014-06-30: 650 mg via ORAL
  Filled 2014-06-30: qty 2

## 2014-06-30 MED ORDER — IBUPROFEN 600 MG PO TABS
600.0000 mg | ORAL_TABLET | Freq: Four times a day (QID) | ORAL | Status: DC | PRN
  Administered 2014-06-30 – 2014-07-03 (×4): 600 mg via ORAL
  Filled 2014-06-30 (×4): qty 1

## 2014-06-30 MED ORDER — SODIUM CHLORIDE 0.9 % IV SOLN
Freq: Once | INTRAVENOUS | Status: AC
  Administered 2014-06-30: 04:00:00 via INTRAVENOUS

## 2014-06-30 MED ORDER — PRENATAL MULTIVITAMIN CH
1.0000 | ORAL_TABLET | Freq: Every day | ORAL | Status: DC
  Administered 2014-07-01 – 2014-07-02 (×2): 1 via ORAL
  Filled 2014-06-30 (×2): qty 1

## 2014-06-30 MED ORDER — LIDOCAINE HCL 2 % EX GEL
1.0000 "application " | Freq: Two times a day (BID) | CUTANEOUS | Status: DC
  Administered 2014-06-30: 5 via TOPICAL
  Filled 2014-06-30 (×7): qty 5

## 2014-06-30 MED ORDER — DEXTROSE 5 % IV SOLN
5.0000 mg/kg | Freq: Three times a day (TID) | INTRAVENOUS | Status: DC
  Administered 2014-06-30 – 2014-07-03 (×10): 250 mg via INTRAVENOUS
  Filled 2014-06-30 (×12): qty 5

## 2014-06-30 MED ORDER — LACTATED RINGERS IV SOLN
INTRAVENOUS | Status: DC
  Administered 2014-06-30 – 2014-07-02 (×3): via INTRAVENOUS

## 2014-06-30 MED ORDER — LACTATED RINGERS IV SOLN: INTRAVENOUS | Status: DC

## 2014-06-30 MED ORDER — NORETHIN-ETH ESTRADIOL-FE 0.4-35 MG-MCG PO CHEW
3.0000 | CHEWABLE_TABLET | Freq: Every day | ORAL | Status: AC
  Administered 2014-06-30 – 2014-07-02 (×3): 3 via ORAL
  Filled 2014-06-30 (×4): qty 3

## 2014-06-30 MED ORDER — OXYCODONE-ACETAMINOPHEN 5-325 MG PO TABS
1.0000 | ORAL_TABLET | ORAL | Status: DC | PRN
  Administered 2014-06-30 – 2014-07-01 (×3): 1 via ORAL
  Filled 2014-06-30 (×3): qty 1

## 2014-06-30 MED ORDER — DIPHENHYDRAMINE HCL 25 MG PO CAPS
25.0000 mg | ORAL_CAPSULE | Freq: Once | ORAL | Status: AC
  Administered 2014-06-30: 25 mg via ORAL
  Filled 2014-06-30: qty 1

## 2014-06-30 MED ORDER — NORETHIN-ETH ESTRADIOL-FE 0.4-35 MG-MCG PO CHEW
2.0000 | CHEWABLE_TABLET | Freq: Every day | ORAL | Status: DC
  Administered 2014-07-03: 2 via ORAL
  Filled 2014-06-30: qty 2

## 2014-06-30 NOTE — Plan of Care (Signed)
Problem: Phase I Progression Outcomes Goal: Pain controlled with appropriate interventions Outcome: Completed/Met Date Met:  06/30/14 Pain is better when patient sits still.Movement in uncomfortable.Xylocaine gel was added to vaginal area topically to see if this would help.

## 2014-06-30 NOTE — Progress Notes (Signed)
Subjective: Patient reports tolerating PO.  Pt c/o pain with voiding. She reports that the lidocaine burns too much and she doesn't want to continue it. She has not begun pericare as yet.  She reports that she was going to work with these lesions last year and she wants to be discharged if possible in the am.   Objective: I have reviewed patient's vital signs, intake and output, medications, labs and microbiology.  General: alert and no distress Resp: clear to auscultation bilaterally GI: soft, non-tender; bowel sounds normal; no masses,  no organomegaly Extremities: extremities normal, atraumatic, no cyanosis or edema Vaginal Bleeding: minimal GU: extensive lesions of the vulva   CBC    Component Value Date/Time   WBC 7.1 06/29/2014 1950   RBC 3.39* 06/29/2014 2104   RBC 3.41* 06/29/2014 1950   HGB 6.7* 06/29/2014 1950   HCT 22.6* 06/29/2014 1950   PLT 161 06/29/2014 1950   MCV 66.3* 06/29/2014 1950   MCH 19.6* 06/29/2014 1950   MCHC 29.6* 06/29/2014 1950   RDW 18.2* 06/29/2014 1950   LYMPHSABS 2.1 06/29/2014 1950   MONOABS 1.1* 06/29/2014 1950   EOSABS 0.0 06/29/2014 1950   BASOSABS 0.1 06/29/2014 1950      Assessment/Plan: 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

## 2014-06-30 NOTE — H&P (Signed)
Brandy Howard is an 18 y.o. female presented to ED for the evaluation of symptomatic anemia and vulva swelling. Patient reports having an un witnessed syncopal episode on Tuesday. She reports the development of vulva swelling on Monday and the appearance of white plaques on Tuesday which are very tender and makes urination painful. Patient states that she has never experienced such a thing in the past. Patient reports experiencing heavier vaginal bleeding with passage of clots. She reports a history of anemia and had not been taken the recommended iron supplement. Patient is sexually active without contraception. She has had a total of 8 different sexual partners this year.  Pertinent Gynecological History: Menses: flow is moderate Bleeding: monthly Contraception: none DES exposure: denies Blood transfusions: none Sexually transmitted diseases: currently at risk Previous GYN Procedures: none  Last mammogram: n/a  Last pap: n/a  OB History: G0, P0   Menstrual History: Patient's last menstrual period was 06/08/2014.    Past Medical History  Diagnosis Date  . Asthma   . Anemia   . Anxiety   . Seasonal allergies     History reviewed. No pertinent past surgical history.  History reviewed. No pertinent family history.  Social History:  reports that she has never smoked. She has never used smokeless tobacco. She reports that she does not drink alcohol or use illicit drugs.  Allergies:  Allergies  Allergen Reactions  . Dust Mite Extract Itching    No prescriptions prior to admission    Review of Systems  All other systems reviewed and are negative.   Blood pressure 109/58, pulse 95, temperature 97.9 F (36.6 C), temperature source Oral, resp. rate 16, height 5\' 3"  (1.6 m), weight 111 lb (50.349 kg), last menstrual period 06/08/2014, SpO2 100.00%. Physical Exam GENERAL: Well-developed, well-nourished female in no acute distress.  HEENT: Normocephalic, atraumatic. Sclerae  anicteric.  LUNGS: Clear to auscultation bilaterally.  HEART: Regular rate and rhythm. ABDOMEN: Soft, nontender, nondistended. No organomegaly. PELVIC: Bilateral labia swelling with extensive herpetic-like lesions on both labia extending towards the urethra and clitoral hood. Vagina is pink and rugated.  Normal discharge. Normal appearing cervix. Uterus is normal in size. No adnexal mass or tenderness. EXTREMITIES: No cyanosis, clubbing, or edema, 2+ distal pulses.  Results for orders placed during the hospital encounter of 06/29/14 (from the past 24 hour(s))  WET PREP, GENITAL     Status: Abnormal   Collection Time    06/29/14  7:47 PM      Result Value Ref Range   Yeast Wet Prep HPF POC NONE SEEN  NONE SEEN   Trich, Wet Prep NONE SEEN  NONE SEEN   Clue Cells Wet Prep HPF POC NONE SEEN  NONE SEEN   WBC, Wet Prep HPF POC FEW (*) NONE SEEN  CBC WITH DIFFERENTIAL     Status: Abnormal   Collection Time    06/29/14  7:50 PM      Result Value Ref Range   WBC 7.1  4.0 - 10.5 K/uL   RBC 3.41 (*) 3.87 - 5.11 MIL/uL   Hemoglobin 6.7 (*) 12.0 - 15.0 g/dL   HCT 22.6 (*) 36.0 - 46.0 %   MCV 66.3 (*) 78.0 - 100.0 fL   MCH 19.6 (*) 26.0 - 34.0 pg   MCHC 29.6 (*) 30.0 - 36.0 g/dL   RDW 18.2 (*) 11.5 - 15.5 %   Platelets 161  150 - 400 K/uL   Neutrophils Relative % 53  43 - 77 %  Lymphocytes Relative 30  12 - 46 %   Monocytes Relative 16 (*) 3 - 12 %   Eosinophils Relative 0  0 - 5 %   Basophils Relative 1  0 - 1 %   Neutro Abs 3.8  1.7 - 7.7 K/uL   Lymphs Abs 2.1  0.7 - 4.0 K/uL   Monocytes Absolute 1.1 (*) 0.1 - 1.0 K/uL   Eosinophils Absolute 0.0  0.0 - 0.7 K/uL   Basophils Absolute 0.1  0.0 - 0.1 K/uL   RBC Morphology ELLIPTOCYTES    COMPREHENSIVE METABOLIC PANEL     Status: Abnormal   Collection Time    06/29/14  7:50 PM      Result Value Ref Range   Sodium 139  137 - 147 mEq/L   Potassium 3.0 (*) 3.7 - 5.3 mEq/L   Chloride 103  96 - 112 mEq/L   CO2 24  19 - 32 mEq/L   Glucose,  Bld 86  70 - 99 mg/dL   BUN 7  6 - 23 mg/dL   Creatinine, Ser 0.78  0.50 - 1.10 mg/dL   Calcium 7.8 (*) 8.4 - 10.5 mg/dL   Total Protein 6.8  6.0 - 8.3 g/dL   Albumin 2.9 (*) 3.5 - 5.2 g/dL   AST 69 (*) 0 - 37 U/L   ALT 16  0 - 35 U/L   Alkaline Phosphatase 37 (*) 39 - 117 U/L   Total Bilirubin 0.3  0.3 - 1.2 mg/dL   GFR calc non Af Amer >90  >90 mL/min   GFR calc Af Amer >90  >90 mL/min   Anion gap 12  5 - 15  RPR     Status: None   Collection Time    06/29/14  7:50 PM      Result Value Ref Range   RPR NON REAC  NON REAC  HIV ANTIBODY (ROUTINE TESTING)     Status: None   Collection Time    06/29/14  7:50 PM      Result Value Ref Range   HIV 1&2 Ab, 4th Generation NONREACTIVE  NONREACTIVE  POC URINE PREG, ED     Status: None   Collection Time    06/29/14  9:02 PM      Result Value Ref Range   Preg Test, Ur NEGATIVE  NEGATIVE  TYPE AND SCREEN     Status: None   Collection Time    06/29/14  9:04 PM      Result Value Ref Range   ABO/RH(D) O POS     Antibody Screen NEG     Sample Expiration 07/02/2014    VITAMIN B12     Status: None   Collection Time    06/29/14  9:04 PM      Result Value Ref Range   Vitamin B-12 615  211 - 911 pg/mL  FOLATE     Status: None   Collection Time    06/29/14  9:04 PM      Result Value Ref Range   Folate >20.0    IRON AND TIBC     Status: Abnormal   Collection Time    06/29/14  9:04 PM      Result Value Ref Range   Iron 10 (*) 42 - 135 ug/dL   TIBC 335  250 - 470 ug/dL   Saturation Ratios 3 (*) 20 - 55 %   UIBC 325  125 - 400 ug/dL  FERRITIN  Status: None   Collection Time    06/29/14  9:04 PM      Result Value Ref Range   Ferritin 32  10 - 291 ng/mL  RETICULOCYTES     Status: Abnormal   Collection Time    06/29/14  9:04 PM      Result Value Ref Range   Retic Ct Pct <0.4 (*) 0.4 - 3.1 %   RBC. 3.39 (*) 3.87 - 5.11 MIL/uL   Retic Count, Manual NOT CALCULATED  19.0 - 186.0 K/uL  ABO/RH     Status: None   Collection Time     06/29/14  9:04 PM      Result Value Ref Range   ABO/RH(D) O POS    URINALYSIS, ROUTINE W REFLEX MICROSCOPIC     Status: Abnormal   Collection Time    06/29/14 10:14 PM      Result Value Ref Range   Color, Urine YELLOW  YELLOW   APPearance CLEAR  CLEAR   Specific Gravity, Urine 1.004 (*) 1.005 - 1.030   pH 6.5  5.0 - 8.0   Glucose, UA NEGATIVE  NEGATIVE mg/dL   Hgb urine dipstick TRACE (*) NEGATIVE   Bilirubin Urine NEGATIVE  NEGATIVE   Ketones, ur NEGATIVE  NEGATIVE mg/dL   Protein, ur NEGATIVE  NEGATIVE mg/dL   Urobilinogen, UA 1.0  0.0 - 1.0 mg/dL   Nitrite NEGATIVE  NEGATIVE   Leukocytes, UA SMALL (*) NEGATIVE  URINE MICROSCOPIC-ADD ON     Status: None   Collection Time    06/29/14 10:14 PM      Result Value Ref Range   WBC, UA 3-6  <3 WBC/hpf   RBC / HPF 0-2  <3 RBC/hpf  PREPARE RBC (CROSSMATCH)     Status: None   Collection Time    06/30/14  2:29 AM      Result Value Ref Range   Order Confirmation ORDER PROCESSED BY BLOOD BANK    TYPE AND SCREEN     Status: None   Collection Time    06/30/14  3:40 AM      Result Value Ref Range   ABO/RH(D) O POS     Antibody Screen NEG     Sample Expiration 07/03/2014     Unit Number [id number]     Blood Component Type RBC CPDA1, LR     Unit division 00     Status of Unit ISSUED     Transfusion Status OK TO TRANSFUSE     Crossmatch Result Compatible     Unit Number [id number]     Blood Component Type RBC CPDA1, LR     Unit division 00     Status of Unit ALLOCATED     Transfusion Status OK TO TRANSFUSE     Crossmatch Result Compatible     Unit Number [id number]     Blood Component Type RED CELLS,LR     Unit division 00     Status of Unit ALLOCATED     Transfusion Status OK TO TRANSFUSE     Crossmatch Result Compatible    ABO/RH     Status: None   Collection Time    06/30/14  3:40 AM      Result Value Ref Range   ABO/RH(D) O POS      US Transvaginal Non-ob  06/29/2014   CLINICAL DATA:  Vaginal bleeding.  Tender uterus and adnexa bilaterally.  EXAM: TRANSABDOMINAL AND TRANSVAGINAL ULTRASOUND OF PELVIS  DOPPLER ULTRASOUND OF  OVARIES  TECHNIQUE: Both transabdominal and transvaginal ultrasound examinations of the pelvis were performed. Transabdominal technique was performed for global imaging of the pelvis including uterus, ovaries, adnexal regions, and pelvic cul-de-sac.  It was necessary to proceed with endovaginal exam following the transabdominal exam to visualize the ovaries. Color and duplex Doppler ultrasound was utilized to evaluate blood flow to the ovaries.  COMPARISON:  None.  FINDINGS: Uterus  Measurements: 6.5 x 3.7 x 3.3 cm. No fibroids or other mass visualized.  Endometrium  Thickness: 7.5 mm.  No focal abnormality visualized.  Right ovary  Measurements: 3.1 x 2.8 x 2.3 cm. Normal appearance/no adnexal mass.  Left ovary  Measurements: 2.8 x 1.5 x 1.2 cm. Normal appearance/no adnexal mass.  Pulsed Doppler evaluation of both ovaries demonstrates normal low-resistance arterial and venous waveforms.  Other findings  Trace amount of free peritoneal fluid, within normal limits of physiological fluid.  IMPRESSION: Normal examination.   Electronically Signed   By: Enrique Sack M.D.   On: 06/29/2014 20:51   US Pelvis Complete  06/29/2014   CLINICAL DATA:  Vaginal bleeding. Tender uterus and adnexa bilaterally.  EXAM: TRANSABDOMINAL AND TRANSVAGINAL ULTRASOUND OF PELVIS  DOPPLER ULTRASOUND OF OVARIES  TECHNIQUE: Both transabdominal and transvaginal ultrasound examinations of the pelvis were performed. Transabdominal technique was performed for global imaging of the pelvis including uterus, ovaries, adnexal regions, and pelvic cul-de-sac.  It was necessary to proceed with endovaginal exam following the transabdominal exam to visualize the ovaries. Color and duplex Doppler ultrasound was utilized to evaluate blood flow to the ovaries.  COMPARISON:  None.  FINDINGS: Uterus  Measurements: 6.5 x 3.7 x 3.3 cm. No  fibroids or other mass visualized.  Endometrium  Thickness: 7.5 mm.  No focal abnormality visualized.  Right ovary  Measurements: 3.1 x 2.8 x 2.3 cm. Normal appearance/no adnexal mass.  Left ovary  Measurements: 2.8 x 1.5 x 1.2 cm. Normal appearance/no adnexal mass.  Pulsed Doppler evaluation of both ovaries demonstrates normal low-resistance arterial and venous waveforms.  Other findings  Trace amount of free peritoneal fluid, within normal limits of physiological fluid.  IMPRESSION: Normal examination.   Electronically Signed   By: Enrique Sack M.D.   On: 06/29/2014 20:51   [place] Art/ven Flow Abd Pelv Doppler  06/29/2014   CLINICAL DATA:  Vaginal bleeding. Tender uterus and adnexa bilaterally.  EXAM: TRANSABDOMINAL AND TRANSVAGINAL ULTRASOUND OF PELVIS  DOPPLER ULTRASOUND OF OVARIES  TECHNIQUE: Both transabdominal and transvaginal ultrasound examinations of the pelvis were performed. Transabdominal technique was performed for global imaging of the pelvis including uterus, ovaries, adnexal regions, and pelvic cul-de-sac.  It was necessary to proceed with endovaginal exam following the transabdominal exam to visualize the ovaries. Color and duplex Doppler ultrasound was utilized to evaluate blood flow to the ovaries.  COMPARISON:  None.  FINDINGS: Uterus  Measurements: 6.5 x 3.7 x 3.3 cm. No fibroids or other mass visualized.  Endometrium  Thickness: 7.5 mm.  No focal abnormality visualized.  Right ovary  Measurements: 3.1 x 2.8 x 2.3 cm. Normal appearance/no adnexal mass.  Left ovary  Measurements: 2.8 x 1.5 x 1.2 cm. Normal appearance/no adnexal mass.  Pulsed Doppler evaluation of both ovaries demonstrates normal low-resistance arterial and venous waveforms.  Other findings  Trace amount of free peritoneal fluid, within normal limits of physiological fluid.  IMPRESSION: Normal examination.   Electronically Signed   By: Enrique Sack M.D.   On: 06/29/2014 20:51   US Abdomen Limited Ruq  06/29/2014  CLINICAL  DATA:  Elevated liver enzymes  EXAM: US ABDOMEN LIMITED - RIGHT UPPER QUADRANT  COMPARISON:  None.  FINDINGS: Gallbladder:  No gallstones or wall thickening visualized. No sonographic Hari sign noted.  Common bile duct:  Diameter: 4 mm  Liver:  No focal lesion identified. Within normal limits in parenchymal echogenicity.  IMPRESSION: Normal right upper quadrant ultrasound   Electronically Signed   By: Skipper Cliche M.D.   On: 06/29/2014 23:01    Assessment/Plan: 18 yo G0P0 with likely primary genital herpes outbreak and symptomatic anemia 1) Primary herpes outbreak - IV acyclovir - Patient declined foley catheter - lidocaine gel on perineum for comfort care - follow up HSV culture  2) Symptomatic anemia - Will transfuse 3 units pRBC - Start OCP taper   Follow up pelvic cultures Hollye Pritt 06/30/2014, 7:28 AM

## 2014-06-30 NOTE — Plan of Care (Signed)
Problem: Phase I Progression Outcomes Goal: OOB as tolerated unless otherwise ordered Outcome: Completed/Met Date Met:  06/30/14 Can walk to bathroom but is painful to walk due to vaginal pain. Goal: Initial discharge plan identified Outcome: Completed/Met Date Met:  06/30/14 Correct low Hgb with blood transfusion Treat vaginal rash HSV? Pain control

## 2014-07-01 LAB — TYPE AND SCREEN
ABO/RH(D): O POS
Antibody Screen: NEGATIVE
UNIT DIVISION: 0
Unit division: 0
Unit division: 0
Unit division: 0

## 2014-07-01 LAB — CBC
HEMATOCRIT: 35.3 % — AB (ref 36.0–46.0)
HEMOGLOBIN: 11.4 g/dL — AB (ref 12.0–15.0)
MCH: 23.7 pg — AB (ref 26.0–34.0)
MCHC: 32.3 g/dL (ref 30.0–36.0)
MCV: 73.4 fL — AB (ref 78.0–100.0)
Platelets: 140 10*3/uL — ABNORMAL LOW (ref 150–400)
RBC: 4.81 MIL/uL (ref 3.87–5.11)
RDW: 21.3 % — ABNORMAL HIGH (ref 11.5–15.5)
WBC: 8.8 10*3/uL (ref 4.0–10.5)

## 2014-07-01 LAB — GC/CHLAMYDIA PROBE AMP
CT PROBE, AMP APTIMA: NEGATIVE
GC Probe RNA: NEGATIVE

## 2014-07-01 MED ORDER — BENZOCAINE-MENTHOL 20-0.5 % EX AERO
1.0000 "application " | INHALATION_SPRAY | Freq: Four times a day (QID) | CUTANEOUS | Status: DC | PRN
  Administered 2014-07-01: 1 via TOPICAL
  Filled 2014-07-01: qty 56

## 2014-07-01 NOTE — Progress Notes (Signed)
Subjective: Patient reports less bleeding than on admission. She reports that the pain in her vulva is unchanged.  She is aware that she will not be ready for discharge and work this afternoon.  She has been doing pericare.  She is voiding with some difficulty but, does not feel the need for a foley cath.   Objective: I have reviewed patient's vital signs, intake and output, medications and labs.  General: alert and mild distress GU: the vulva has extensive HSV lesions- unchanged from yesterdays exam.    CBC    Component Value Date/Time   WBC 8.8 07/01/2014 0520   RBC 4.81 07/01/2014 0520   RBC 3.39* 06/29/2014 2104   HGB 11.4* 07/01/2014 0520   HCT 35.3* 07/01/2014 0520   PLT 140* 07/01/2014 0520   MCV 73.4* 07/01/2014 0520   MCH 23.7* 07/01/2014 0520   MCHC 32.3 07/01/2014 0520   RDW 21.3* 07/01/2014 0520   LYMPHSABS 2.1 06/29/2014 1950   MONOABS 1.1* 06/29/2014 1950   EOSABS 0.0 06/29/2014 1950   BASOSABS 0.1 06/29/2014 1950   .   Assessment/Plan: Keep IV acyclovir until pt improved clinically pericare Keep OCP taper  LOS: 2 days    HARRAWAY-SMITH, Sylar Voong 07/01/2014, 9:33 AM

## 2014-07-01 NOTE — Progress Notes (Signed)
Ur chart review completed.  

## 2014-07-02 NOTE — Progress Notes (Addendum)
Subjective: Patient reports less bleeding than on admission.  Pain has started to improve however is not greatly improved, still has pain with ambulation. - no longer as motivated to have prompt discharge as previously, now understands that she cannot be fired from missing work while hospitalized which she previously thought might be the case  Nausea: ate chicken strips that she left out all day and felt nauseous.  "I don't think it's laying down, I lay down all the time"  Objective: I have reviewed patient's vital signs, intake and output, medications and labs.  BP 105/62  Pulse 69  Temp(Src) 98.7 F (37.1 C) (Oral)  Resp 18  Ht 5\' 3"  (1.6 m)  Wt 111 lb (50.349 kg)  BMI 19.67 kg/m2  SpO2 100%  LMP 06/08/2014  General: alert and mild distress Resp: no acute distress, normal effort Cardiac: regular rate Abd: soft nontender GU: the vulva has extensive HSV lesions- unchanged from yesterdays exam.  No signs of superimposed infection at this time  CBC    Component Value Date/Time   WBC 8.8 07/01/2014 0520   RBC 4.81 07/01/2014 0520   RBC 3.39* 06/29/2014 2104   HGB 11.4* 07/01/2014 0520   HCT 35.3* 07/01/2014 0520   PLT 140* 07/01/2014 0520   MCV 73.4* 07/01/2014 0520   MCH 23.7* 07/01/2014 0520   MCHC 32.3 07/01/2014 0520   RDW 21.3* 07/01/2014 0520   LYMPHSABS 2.1 06/29/2014 1950   MONOABS 1.1* 06/29/2014 1950   EOSABS 0.0 06/29/2014 1950   BASOSABS 0.1 06/29/2014 1950   .   Assessment/Plan: 18 yo with dysfunction uterine bleeding and primary HSV s/p 3upRBCs and on IV acyclovir - Keep IV acyclovir, plan for likely discharge tomorrow with PO acyclovir - continue pericare - Keep OCP taper - prn antiemetics nausea, advised to walk around and eat meals in chair   LOS: 3 days    Jamesetta Greenhalgh ROCIO 07/02/2014, 9:50 PM

## 2014-07-03 DIAGNOSIS — B009 Herpesviral infection, unspecified: Secondary | ICD-10-CM

## 2014-07-03 DIAGNOSIS — D649 Anemia, unspecified: Secondary | ICD-10-CM

## 2014-07-03 LAB — HERPES SIMPLEX VIRUS CULTURE
Culture: DETECTED
SPECIAL REQUESTS: NORMAL

## 2014-07-03 MED ORDER — VALACYCLOVIR HCL 1 G PO TABS: 1000.0000 mg | ORAL_TABLET | Freq: Every day | ORAL | Status: DC

## 2014-07-03 MED ORDER — NORETHIN-ETH ESTRADIOL-FE 0.4-35 MG-MCG PO CHEW: 1.0000 | CHEWABLE_TABLET | Freq: Every day | ORAL | Status: DC

## 2014-07-03 MED ORDER — BENZOCAINE-MENTHOL 20-0.5 % EX AERO: 1.0000 "application " | INHALATION_SPRAY | Freq: Four times a day (QID) | CUTANEOUS | Status: DC | PRN

## 2014-07-03 MED ORDER — OXYCODONE-ACETAMINOPHEN 5-325 MG PO TABS: 1.0000 | ORAL_TABLET | ORAL | Status: DC | PRN

## 2014-07-03 MED ORDER — DIPHENHYDRAMINE HCL 25 MG PO CAPS
25.0000 mg | ORAL_CAPSULE | Freq: Once | ORAL | Status: AC
  Administered 2014-07-03: 25 mg via ORAL
  Filled 2014-07-03: qty 1

## 2014-07-03 MED ORDER — IBUPROFEN 600 MG PO TABS: 600.0000 mg | ORAL_TABLET | Freq: Four times a day (QID) | ORAL | Status: DC | PRN

## 2014-07-03 NOTE — Progress Notes (Signed)
Discharge instructions reviewed with patient.  Patient states understanding of home care, medications, activity, signs/symptoms to report to MD and return MD office visit.  Patients family will assist with her care @ home and no home equipment needed.  Patient ambulated for discharge in stable condition with staff without incident.

## 2014-07-03 NOTE — Discharge Instructions (Signed)
Genital Herpes °Genital herpes is a sexually transmitted disease. This means that it is a disease passed by having sex with an infected person. There is no cure for genital herpes. The time between attacks can be months to years. The virus may live in a person but produce no problems (symptoms). This infection can be passed to a baby as it travels down the birth canal (vagina). In a newborn, this can cause central nervous system damage, eye damage, or even death. The virus that causes genital herpes is usually HSV-2 virus. The virus that causes oral herpes is usually HSV-1. The diagnosis (learning what is wrong) is made through culture results. °SYMPTOMS  °Usually symptoms of pain and itching begin a few days to a week after contact. It first appears as small blisters that progress to small painful ulcers which then scab over and heal after several days. It affects the outer genitalia, birth canal, cervix, penis, anal area, buttocks, and thighs. °HOME CARE INSTRUCTIONS  °· Keep ulcerated areas dry and clean. °· Take medications as directed. Antiviral medications can speed up healing. They will not prevent recurrences or cure this infection. These medications can also be taken for suppression if there are frequent recurrences. °· While the infection is active, it is contagious. Avoid all sexual contact during active infections. °· Condoms may help prevent spread of the herpes virus. °· Practice safe sex. °· Wash your hands thoroughly after touching the genital area. °· Avoid touching your eyes after touching your genital area. °· Inform your caregiver if you have had genital herpes and become pregnant. It is your responsibility to insure a safe outcome for your baby in this pregnancy. °· Only take over-the-counter or prescription medicines for pain, discomfort, or fever as directed by your caregiver. °SEEK MEDICAL CARE IF:  °· You have a recurrence of this infection. °· You do not respond to medications and are not  improving. °· You have new sources of pain or discharge which have changed from the original infection. °· You have an oral temperature above 102° F (38.9° C). °· You develop abdominal pain. °· You develop eye pain or signs of eye infection. °Document Released: 08/27/2000 Document Revised: 11/22/2011 Document Reviewed: 09/17/2009 °Exit[employer]® Patient Information ©2015 [employer]. This information is not intended to replace advice given to you by your health care provider. Make sure you discuss any questions you have with your health care provider. ° °

## 2014-07-03 NOTE — Discharge Summary (Signed)
Physician Discharge Summary  Patient ID: Brandy Howard MRN: [id number] DOB/AGE: [birth year] 19 y.o.  Admit date: 06/29/2014 Discharge date: 07/03/2014  Admission Diagnoses: symptomatic anemia, active genital herpes infection  Discharge Diagnoses:  Active Problems:   Anemia   Symptomatic anemia   Herpes infection   Discharged Condition: good  Hospital Course: patient admitted with symptomatic anemia secondary to abnormal uterine bleeding. Patient also noted to have active genital herpes. Patient was treated with a blood transfusion and OCP taper. She received IV acyclovir. Her symptoms significantly improved on day 4 and the patient was found stable for discharge. OCP and valtrex were prescribed. Long discussion on safe sex practices. Discharge instructions reviewed  Consults: None  Treatments: pRBC and IV acyclovir  Discharge Exam: Blood pressure 98/56, pulse 73, temperature 98.1 F (36.7 C), temperature source Rectal, resp. rate 16, height 5\' 3"  (1.6 m), weight 111 lb (50.349 kg), last menstrual period 06/08/2014, SpO2 100.00%. General appearance: alert, cooperative and no distress Resp: clear to auscultation bilaterally Cardio: regular rate and rhythm GI: soft, non-tender; bowel sounds normal; no masses,  no organomegaly Pelvic: significant decrease in labia erythema, several lesions in the process of healing Extremities: Homans sign is negative, no sign of DVT and no edema, redness or tenderness in the calves or thighs  Disposition: 01-Home or Self Care     Medication List         benzocaine-Menthol 20-0.5 % Aero  Commonly known as:  DERMOPLAST  Apply 1 application topically 4 (four) times daily as needed for irritation.     ibuprofen 600 MG tablet  Commonly known as:  ADVIL,MOTRIN  Take 1 tablet (600 mg total) by mouth every 6 (six) hours as needed (mild pain).     Norethin-Eth Estradiol-Fe 0.4-35 MG-MCG tablet  Commonly known as:  FEMCON FE,WYMZYA FE,ZENCHENT  FE,ZEOSA  Chew 1 tablet by mouth daily.     oxyCODONE-acetaminophen 5-325 MG per tablet  Commonly known as:  PERCOCET/ROXICET  Take 1-2 tablets by mouth every 3 (three) hours as needed (moderate to severe pain (when tolerating fluids)).     valACYclovir 1000 MG tablet  Commonly known as:  VALTREX  Take 1 tablet (1,000 mg total) by mouth daily.           Follow-up Information   Follow up with [facility]. (As needed, If symptoms worsen)    Specialty:  Obstetrics and Gynecology   Contact information:   [place] [address] [phone]      Signed: Mora Bellman 07/03/2014, 9:27 AM

## 2014-07-03 NOTE — Progress Notes (Cosign Needed)
Subjective: Patient reports no bleeding, dizziness, or headaches. She says her pain is better and only hurts when she moves. She denies any nausea today but says she has not tried to eat anything yet.   She endorses a "bug bite" on her left calf that is painful. She says she first noticed it a few days ago. She says it used to be painful when she walked but this morning she denies any pain with walking. She reports being given a motrin and benadryl last night by the nurse.   She is ready to be discharged home today.    Objective: I have reviewed patient's vital signs. Filed Vitals:   07/03/14 0532  BP: 98/56  Pulse: 73  Temp: 98.1 F (36.7 C)  Resp: 16    General: alert and cooperative Resp: clear to auscultation bilaterally and normal effort Cardio: regular rate and rhythm, S1, S2 normal, no murmur, click, rub or gallop GI: soft, non-tender; bowel sounds normal; no masses,  no organomegaly Extremities: Homans sign is negative, no sign of DVT GU: vulva with extensive HSV lesions. No other signs of infection. Skin: 1 cm area of erythema with pinpoint white tip on left lateral calf. Tender to palpation. No increased warmth.   CBC    Component Value Date/Time   WBC 8.8 07/01/2014 0520   RBC 4.81 07/01/2014 0520   RBC 3.39* 06/29/2014 2104   HGB 11.4* 07/01/2014 0520   HCT 35.3* 07/01/2014 0520   PLT 140* 07/01/2014 0520   MCV 73.4* 07/01/2014 0520   MCH 23.7* 07/01/2014 0520   MCHC 32.3 07/01/2014 0520   RDW 21.3* 07/01/2014 0520   LYMPHSABS 2.1 06/29/2014 1950   MONOABS 1.1* 06/29/2014 1950   EOSABS 0.0 06/29/2014 1950   BASOSABS 0.1 06/29/2014 1950     Assessment/Plan: 18 y/o female with dysfunctional uterine bleeding and primary HSV s/p 3upRBCs and on IV acyclovir  - Continue Acyclovir po  - Continue pericare - D/C home      LOS: 4 days    Bruce Donath 07/03/2014, 9:22 AM

## 2015-03-10 ENCOUNTER — Emergency Department (HOSPITAL_COMMUNITY)
Admission: EM | Admit: 2015-03-10 | Discharge: 2015-03-10 | Disposition: A | Discharge status: Home / Self Care | Payer: No Typology Code available for payment source | Attending: Emergency Medicine | Admitting: Emergency Medicine

## 2015-03-10 ENCOUNTER — Encounter (HOSPITAL_COMMUNITY): Payer: Self-pay | Admitting: Emergency Medicine

## 2015-03-10 DIAGNOSIS — Z79899 Other long term (current) drug therapy: Secondary | ICD-10-CM | POA: Insufficient documentation

## 2015-03-10 DIAGNOSIS — J029 Acute pharyngitis, unspecified: Secondary | ICD-10-CM | POA: Insufficient documentation

## 2015-03-10 DIAGNOSIS — R000 Tachycardia, unspecified: Secondary | ICD-10-CM | POA: Insufficient documentation

## 2015-03-10 DIAGNOSIS — Z793 Long term (current) use of hormonal contraceptives: Secondary | ICD-10-CM | POA: Insufficient documentation

## 2015-03-10 DIAGNOSIS — J45909 Unspecified asthma, uncomplicated: Secondary | ICD-10-CM | POA: Insufficient documentation

## 2015-03-10 DIAGNOSIS — Z8659 Personal history of other mental and behavioral disorders: Secondary | ICD-10-CM | POA: Insufficient documentation

## 2015-03-10 DIAGNOSIS — Z862 Personal history of diseases of the blood and blood-forming organs and certain disorders involving the immune mechanism: Secondary | ICD-10-CM | POA: Insufficient documentation

## 2015-03-10 LAB — RAPID STREP SCREEN (MED CTR MEBANE ONLY): Streptococcus, Group A Screen (Direct): NEGATIVE

## 2015-03-10 MED ORDER — DEXAMETHASONE SODIUM PHOSPHATE 10 MG/ML IJ SOLN
10.0000 mg | Freq: Once | INTRAMUSCULAR | Status: AC
  Administered 2015-03-10: 10 mg via INTRAVENOUS
  Filled 2015-03-10: qty 1

## 2015-03-10 MED ORDER — SODIUM CHLORIDE 0.9 % IV BOLUS (SEPSIS)
1000.0000 mL | Freq: Once | INTRAVENOUS | Status: AC
  Administered 2015-03-10: 1000 mL via INTRAVENOUS

## 2015-03-10 MED ORDER — PENICILLIN V POTASSIUM 500 MG PO TABS
500.0000 mg | ORAL_TABLET | Freq: Once | ORAL | Status: AC
  Administered 2015-03-10: 500 mg via ORAL
  Filled 2015-03-10: qty 1

## 2015-03-10 MED ORDER — KETOROLAC TROMETHAMINE 15 MG/ML IJ SOLN
15.0000 mg | Freq: Once | INTRAMUSCULAR | Status: AC
  Administered 2015-03-10: 15 mg via INTRAVENOUS
  Filled 2015-03-10: qty 1

## 2015-03-10 MED ORDER — HYDROMORPHONE HCL 1 MG/ML IJ SOLN
0.7500 mg | Freq: Once | INTRAMUSCULAR | Status: AC
  Administered 2015-03-10: 0.75 mg via INTRAVENOUS
  Filled 2015-03-10: qty 1

## 2015-03-10 MED ORDER — PENICILLIN V POTASSIUM 500 MG PO TABS: 500.0000 mg | ORAL_TABLET | Freq: Four times a day (QID) | ORAL | Status: AC

## 2015-03-10 MED ORDER — HYDROCODONE-ACETAMINOPHEN 7.5-325 MG/15ML PO SOLN: 10.0000 mL | Freq: Four times a day (QID) | ORAL | Status: DC | PRN

## 2015-03-10 NOTE — Discharge Instructions (Signed)

## 2015-03-10 NOTE — ED Notes (Addendum)
Pt c/o sore throat x 2 days. Is painful to swallow.  Pt has fever and tachycardiac in triage. Pt states that she took Tylenol at 6am this morning.

## 2015-03-10 NOTE — ED Provider Notes (Signed)
CSN: [id number]     Arrival date & time 03/10/15  1032 History   First MD Initiated Contact with Patient 03/10/15 1038     Chief Complaint  Patient presents with  . Sore Throat  . Fever     (Consider location/radiation/quality/duration/timing/severity/associated sxs/prior Treatment) HPI  19 year old female with sore throat, fever and body aches. Symptom onset 2 days ago. Relatively constant since then. No cough. Pain with swallowing. No dizziness or lightheadedness. No nausea or vomiting. No diarrhea. No sick contacts. Reports past history of recurrent pharyngitis. Decreased PO intake.  Past Medical History  Diagnosis Date  . Asthma   . Anemia   . Anxiety   . Seasonal allergies    History reviewed. No pertinent past surgical history. No family history on file. History  Substance Use Topics  . Smoking status: Never Smoker   . Smokeless tobacco: Never Used  . Alcohol Use: No   OB History    No data available     Review of Systems  All systems reviewed and negative, other than as noted in HPI.   Allergies  Dust mite extract  Home Medications   Prior to Admission medications   Medication Sig Start Date End Date Taking? Authorizing Provider  benzocaine-Menthol (DERMOPLAST) 20-0.5 % AERO Apply 1 application topically 4 (four) times daily as needed for irritation. 07/03/14  Yes Peggy Constant, MD  ibuprofen (ADVIL,MOTRIN) 600 MG tablet Take 1 tablet (600 mg total) by mouth every 6 (six) hours as needed (mild pain). 07/03/14  Yes Peggy Constant, MD  oxyCODONE-acetaminophen (PERCOCET/ROXICET) 5-325 MG per tablet Take 1-2 tablets by mouth every 3 (three) hours as needed (moderate to severe pain (when tolerating fluids)). 07/03/14  Yes Peggy Constant, MD  valACYclovir (VALTREX) 1000 MG tablet Take 1 tablet (1,000 mg total) by mouth daily. 07/03/14  Yes Peggy Constant, MD  Norethin-Eth Estradiol-Fe [facility] FE,WYMZYA Orrin Brigham) 0.4-35 MG-MCG tablet Chew 1 tablet by  mouth daily. 07/03/14   Peggy Constant, MD   BP 110/55 mmHg  Pulse 128  Temp(Src) 102.6 F (39.2 C) (Oral)  Resp 22  SpO2 100%  LMP 03/03/2015 Physical Exam  Constitutional: She appears well-developed and well-nourished. No distress.  HENT:  Head: Normocephalic and atraumatic.  Exudative pharyngitis/tonsilitis Uvula midline Normal sounding phonation Handing secretions Neck supple   Eyes: Conjunctivae are normal. Right eye exhibits no discharge. Left eye exhibits no discharge.  Neck: Neck supple.  Cardiovascular: Regular rhythm and normal heart sounds.  Exam reveals no gallop and no friction rub.   No murmur heard. Very tachycardic  Pulmonary/Chest: Effort normal and breath sounds normal. No respiratory distress.  Abdominal: Soft. She exhibits no distension. There is no tenderness.  Musculoskeletal: She exhibits no edema or tenderness.  Neurological: She is alert.  Skin: Skin is warm and dry.  Psychiatric: She has a normal mood and affect. Her behavior is normal. Thought content normal.  Nursing note and vitals reviewed.   ED Course  Procedures (including critical care time) Labs Review Labs Reviewed  RAPID STREP SCREEN (NOT AT [facility])  CULTURE, GROUP A STREP    Imaging Review No results found.   EKG Interpretation None      MDM   Final diagnoses:  Exudative pharyngitis    19 year old female with pharyngitis. No evidence of airway compromise or other signs of serious deep space head/neck infection.  Virgel Manifold, MD 03/12/15 [phone]

## 2015-03-10 NOTE — ED Notes (Signed)
Pt c/o sore throat that began 2 days ago, she also has a headache and body aches.  HR currently 132. Temp 102.6.  Patient last took tylenol at 6am this morning.  Denies nausea, vomiting, diarrhea.  LBM 2 days ago.    She hasn't been around anyone sick that she is aware of.

## 2015-03-10 NOTE — ED Notes (Signed)
Bed: BX43 Expected date:  Expected time:  Means of arrival:  Comments: tr5

## 2015-03-10 NOTE — ED Notes (Signed)
Bed: WTR5 Expected date:  Expected time:  Means of arrival:  Comments: 

## 2015-03-11 ENCOUNTER — Other Ambulatory Visit: Payer: Self-pay | Admitting: Obstetrics and Gynecology

## 2015-03-11 ENCOUNTER — Encounter (HOSPITAL_COMMUNITY): Payer: Self-pay | Admitting: Emergency Medicine

## 2015-03-11 ENCOUNTER — Emergency Department (HOSPITAL_COMMUNITY)
Admission: EM | Admit: 2015-03-11 | Discharge: 2015-03-12 | Disposition: A | Discharge status: Home / Self Care | Payer: No Typology Code available for payment source | Attending: Emergency Medicine | Admitting: Emergency Medicine

## 2015-03-11 DIAGNOSIS — Z862 Personal history of diseases of the blood and blood-forming organs and certain disorders involving the immune mechanism: Secondary | ICD-10-CM | POA: Insufficient documentation

## 2015-03-11 DIAGNOSIS — Z79899 Other long term (current) drug therapy: Secondary | ICD-10-CM | POA: Insufficient documentation

## 2015-03-11 DIAGNOSIS — Z8659 Personal history of other mental and behavioral disorders: Secondary | ICD-10-CM | POA: Insufficient documentation

## 2015-03-11 DIAGNOSIS — J45909 Unspecified asthma, uncomplicated: Secondary | ICD-10-CM | POA: Insufficient documentation

## 2015-03-11 DIAGNOSIS — J039 Acute tonsillitis, unspecified: Secondary | ICD-10-CM | POA: Insufficient documentation

## 2015-03-11 MED ORDER — IBUPROFEN 200 MG PO TABS
600.0000 mg | ORAL_TABLET | Freq: Once | ORAL | Status: AC
  Administered 2015-03-11: 600 mg via ORAL

## 2015-03-11 MED ORDER — DEXAMETHASONE 4 MG PO TABS
6.0000 mg | ORAL_TABLET | Freq: Once | ORAL | Status: AC
  Administered 2015-03-11: 6 mg via ORAL
  Filled 2015-03-11 (×2): qty 1

## 2015-03-11 MED ORDER — ACETAMINOPHEN 325 MG PO TABS: 650.0000 mg | ORAL_TABLET | Freq: Four times a day (QID) | ORAL | Status: DC | PRN

## 2015-03-11 MED ORDER — IBUPROFEN 100 MG/5ML PO SUSP
600.0000 mg | Freq: Once | ORAL | Status: DC
  Filled 2015-03-11: qty 30

## 2015-03-11 NOTE — ED Provider Notes (Signed)
CSN: [id number]     Arrival date & time 03/11/15  2104 History  This chart was scribed for Junius Creamer, NP, working with Lacretia Leigh, MD by Julien Nordmann, ED Scribe. This patient was seen in room WTR5/WTR5 and the patient's care was started at 11:05 PM.    Chief Complaint  Patient presents with  . Fever  . Sore Throat     Patient is a 19 y.o. female presenting with pharyngitis. The history is provided by the patient. No language interpreter was used.  Sore Throat    HPI Comments: Brandy Howard is a 19 y.o. female who presents to the Emergency Department complaining of a sore throat onset 4 days ago. She notes associated headache, chest pain, generalized body aches, and fever (TMax 103.4). She states feeling short of breath when she lays down that started last night. Pt notes she has been sleeping for 4 days straight. She notes waking up and going right back to sleep. Pt was seen yesterday for same symptoms and was told that her strep test was negative. Per relative, pt is known to be anemic. Pt has taken OTC tylenol to alleviate the pain with minimal relief. Pt denies nausea, vomiting, rhinorrhea, and cough.  Past Medical History  Diagnosis Date  . Asthma   . Anemia   . Anxiety   . Seasonal allergies    History reviewed. No pertinent past surgical history. History reviewed. No pertinent family history. History  Substance Use Topics  . Smoking status: Never Smoker   . Smokeless tobacco: Never Used  . Alcohol Use: No   OB History    No data available     Review of Systems    Allergies  Dust mite extract  Home Medications   Prior to Admission medications   Medication Sig Start Date End Date Taking? Authorizing Provider  benzocaine-Menthol (DERMOPLAST) 20-0.5 % AERO Apply 1 application topically 4 (four) times daily as needed for irritation. 07/03/14   Peggy Constant, MD  HYDROcodone-acetaminophen (HYCET) 7.5-325 mg/15 ml solution Take 10 mLs by mouth every 6 (six)  hours as needed for moderate pain. 03/10/15   Virgel Manifold, MD  ibuprofen (ADVIL,MOTRIN) 600 MG tablet Take 1 tablet (600 mg total) by mouth once. 03/12/15   Junius Creamer, NP  Norethin-Eth Estradiol-Fe [facility] FE,WYMZYA Orrin Brigham) 0.4-35 MG-MCG tablet Chew 1 tablet by mouth daily. 07/03/14   Peggy Constant, MD  oxyCODONE-acetaminophen (PERCOCET/ROXICET) 5-325 MG per tablet Take 1-2 tablets by mouth every 3 (three) hours as needed (moderate to severe pain (when tolerating fluids)). 07/03/14   Peggy Constant, MD  penicillin v potassium (VEETID) 500 MG tablet Take 1 tablet (500 mg total) by mouth 4 (four) times daily. 03/10/15 03/17/15  Virgel Manifold, MD  valACYclovir (VALTREX) 1000 MG tablet Take 1 tablet (1,000 mg total) by mouth daily. 07/03/14   Peggy Constant, MD   BP 113/69 mmHg  Pulse 131  Temp(Src) 101.8 F (38.8 C) (Oral)  Resp 24  SpO2 100%  LMP 03/03/2015 Physical Exam  ED Course  Procedures (including critical care time) Labs Review Labs Reviewed - No data to display  Imaging Review No results found.   EKG Interpretation None     Patient's strep test was negative.  Yesterday.  She has persistent symptoms.  She's been given a dose of Decadron in the emergency department for swelling.  She been instructed to take Tylenol, ibuprofen or Naprosyn on a regular basis for symptom relief.  She's also been given a referral  to Dr. Erik Obey ENT if symptoms persist MDM   Final diagnoses:  Exudative tonsillitis   I personally performed the services described in this documentation, which was scribed in my presence. The recorded information has been reviewed and is accurate.  Junius Creamer, NP 03/12/15 0013  Julianne Rice, MD 03/12/15 [id number]

## 2015-03-11 NOTE — ED Notes (Signed)
Pt states that she has had a fever and sore throat x 4 days. Seen yesterday and told that she did not have strep throat. Alert and oriented.

## 2015-03-12 LAB — CULTURE, GROUP A STREP

## 2015-03-12 MED ORDER — IBUPROFEN 600 MG PO TABS: 600.0000 mg | ORAL_TABLET | Freq: Once | ORAL | Status: DC

## 2015-03-12 NOTE — Discharge Instructions (Signed)
Pharyngitis Pharyngitis is a sore throat (pharynx). There is redness, pain, and swelling of your throat. HOME CARE   Drink enough fluids to keep your pee (urine) clear or pale yellow.  Only take medicine as told by your doctor.  You may get sick again if you do not take medicine as told. Finish your medicines, even if you start to feel better.  Do not take aspirin.  Rest.  Rinse your mouth (gargle) with salt water ( tsp of salt per 1 qt of water) every 1-2 hours. This will help the pain.  If you are not at risk for choking, you can suck on hard candy or sore throat lozenges. GET HELP IF:  You have large, tender lumps on your neck.  You have a rash.  You cough up green, yellow-brown, or bloody spit. GET HELP RIGHT AWAY IF:   You have a stiff neck.  You drool or cannot swallow liquids.  You throw up (vomit) or are not able to keep medicine or liquids down.  You have very bad pain that does not go away with medicine.  You have problems breathing (not from a stuffy nose). MAKE SURE YOU:   Understand these instructions.  Will watch your condition.  Will get help right away if you are not doing well or get worse. Document Released: 02/16/2008 Document Revised: 06/20/2013 Document Reviewed: 05/07/2013 [facility] Patient Information 2015 [place], [place]. This information is not intended to replace advice given to you by your health care provider. Make sure you discuss any questions you have with your health care provider. Is take ibuprofen or Naprosyn on a regular basis for your discomfort, you were given a dose of Decadron in the emergency department help with swelling.  He will also begin a referral to the ENT doctor if your symptoms do not resolve or get worse

## 2015-10-01 ENCOUNTER — Encounter: Payer: Self-pay | Admitting: Obstetrics and Gynecology

## 2015-10-01 ENCOUNTER — Ambulatory Visit (INDEPENDENT_AMBULATORY_CARE_PROVIDER_SITE_OTHER): Payer: Medicaid Other | Admitting: Obstetrics and Gynecology

## 2015-10-01 VITALS — BP 113/68 | HR 68 | Temp 98.3°F | Resp 20 | Ht 63.0 in | Wt 109.4 lb

## 2015-10-01 DIAGNOSIS — R319 Hematuria, unspecified: Secondary | ICD-10-CM

## 2015-10-01 DIAGNOSIS — Z3041 Encounter for surveillance of contraceptive pills: Secondary | ICD-10-CM | POA: Diagnosis not present

## 2015-10-01 MED ORDER — NORETHIN-ETH ESTRADIOL-FE 0.4-35 MG-MCG PO CHEW: 1.0000 | CHEWABLE_TABLET | Freq: Every day | ORAL | Status: DC

## 2015-10-01 NOTE — Progress Notes (Signed)
Pt is here today for several reasons. First she reports having perineal swelling about 3 weeks ago and does not know why. She also had a "boil" in her Lt groin area which lasted a week and then burst open and drained clear liquid mixed with blood. She also reports seeing blood when she urinated but thinks it was from her vagina. This happened 1 week ago.

## 2015-10-01 NOTE — Progress Notes (Signed)
Patient ID: Brandy Howard, female   DOB: Oct 25, 1995, 20 y.o.   MRN: [id number] 20 yo here for the evaluation of a boil which was present a week ago. Patient reports that it became as large as a golf ball, tender to touch. It occurred on her perineum at a location where she often shaves. Patient also reports noticing blood following urination. She states this occurred on 3 different occasions. She is almost certain that it is not coming from her vagina. It once occurred following intercourse. She is sexually active without contraception and is interested in re-starting OCP. She denies any abdominal pain or back/flank pain. She denies dysuria, frequency or urgency  Past Medical History  Diagnosis Date  . Asthma   . Anemia   . Anxiety   . Seasonal allergies    History reviewed. No pertinent past surgical history. History reviewed. No pertinent family history. Social History  Substance Use Topics  . Smoking status: Never Smoker   . Smokeless tobacco: Never Used  . Alcohol Use: No   ROS See pertinent in HPI  Blood pressure 113/68, pulse 68, temperature 98.3 F (36.8 C), resp. rate 20, height 5\' 3"  (1.6 m), weight 109 lb 6.4 oz (49.624 kg), last menstrual period 09/09/2015. GENERAL: Well-developed, well-nourished female in no acute distress.  ABDOMEN: Soft, nontender, nondistended. No organomegaly. PELVIC: Normal external female genitalia. Vagina is pink and rugated.  Normal discharge. Normal appearing cervix. Uterus is normal in size. No adnexal mass or tenderness. Boil area completely healed EXTREMITIES: No cyanosis, clubbing, or edema, 2+ distal pulses.  A/P 20 yo with hematuria and recent boil - Urine culture collected - OCP refilled - Advised not to shave the area to prevent recurrence of boils - RTC prn

## 2015-10-02 LAB — URINE CULTURE

## 2015-10-03 MED ORDER — FLUCONAZOLE 150 MG PO TABS: 150.0000 mg | ORAL_TABLET | Freq: Once | ORAL | Status: DC

## 2015-10-03 NOTE — Addendum Note (Signed)
Addended by: Mora Bellman on: 10/03/2015 02:33 PM   Modules accepted: Orders

## 2015-10-08 ENCOUNTER — Telehealth: Payer: Self-pay | Admitting: General Practice

## 2015-10-08 NOTE — Telephone Encounter (Signed)
Per Dr Elly Modena, yeast infection was noted on urine specimen. Diflucan has been sent to pharmacy. Called patient & informed her of results & medication at pharmacy. Patient verbalized understanding & had no questions

## 2016-01-19 ENCOUNTER — Ambulatory Visit (INDEPENDENT_AMBULATORY_CARE_PROVIDER_SITE_OTHER): Payer: Self-pay | Admitting: *Deleted

## 2016-01-19 DIAGNOSIS — Z3202 Encounter for pregnancy test, result negative: Secondary | ICD-10-CM

## 2016-01-19 DIAGNOSIS — N912 Amenorrhea, unspecified: Secondary | ICD-10-CM

## 2016-01-19 LAB — POCT PREGNANCY, URINE: Preg Test, Ur: NEGATIVE

## 2016-01-19 NOTE — Progress Notes (Signed)
Patient pregnancy test negative. Stated took test at home this morning, also negative. Was expecting period 8-9 days ago. Stated she is having really bad acne and her breasts are sore. I advised her to wait another week and if she hasn't gotten her period take another home test. If still negative she can come to the clinic to be seen. Understanding voiced.

## 2016-02-07 ENCOUNTER — Encounter (HOSPITAL_COMMUNITY): Payer: Self-pay | Admitting: *Deleted

## 2016-02-07 ENCOUNTER — Inpatient Hospital Stay (HOSPITAL_COMMUNITY)
Admission: AD | Admit: 2016-02-07 | Discharge: 2016-02-07 | Disposition: A | Discharge status: Home / Self Care | Payer: Medicaid Other | Source: Ambulatory Visit | Attending: Family Medicine | Admitting: Family Medicine

## 2016-02-07 DIAGNOSIS — N907 Vulvar cyst: Secondary | ICD-10-CM | POA: Insufficient documentation

## 2016-02-07 LAB — URINALYSIS, ROUTINE W REFLEX MICROSCOPIC
Bilirubin Urine: NEGATIVE
Glucose, UA: NEGATIVE mg/dL
Ketones, ur: NEGATIVE mg/dL
Nitrite: NEGATIVE
Protein, ur: NEGATIVE mg/dL
Specific Gravity, Urine: <1.005 — ABNORMAL LOW (ref 1.005–1.030)
pH: 6 (ref 5.0–8.0)

## 2016-02-07 LAB — URINE MICROSCOPIC-ADD ON

## 2016-02-07 LAB — POCT PREGNANCY, URINE: Preg Test, Ur: NEGATIVE

## 2016-02-07 LAB — WET PREP, GENITAL
Clue Cells Wet Prep HPF POC: NONE SEEN
Sperm: NONE SEEN
TRICH WET PREP: NONE SEEN
Yeast Wet Prep HPF POC: NONE SEEN

## 2016-02-07 MED ORDER — LIDOCAINE HCL (PF) 1 % IJ SOLN
INTRAMUSCULAR | Status: AC
  Filled 2016-02-07: qty 30

## 2016-02-07 MED ORDER — LIDOCAINE HCL (PF) 1 % IJ SOLN
30.0000 mL | Freq: Once | INTRAMUSCULAR | Status: AC
  Administered 2016-02-07: 30 mL via INTRADERMAL

## 2016-02-07 MED ORDER — OXYCODONE-ACETAMINOPHEN 5-325 MG PO TABS: 1.0000 | ORAL_TABLET | Freq: Four times a day (QID) | ORAL | Status: DC | PRN

## 2016-02-07 MED ORDER — SULFAMETHOXAZOLE-TRIMETHOPRIM 800-160 MG PO TABS: 1.0000 | ORAL_TABLET | Freq: Two times a day (BID) | ORAL | Status: AC

## 2016-02-07 NOTE — Discharge Instructions (Signed)
Bartholin Cyst or Abscess A Bartholin cyst is a fluid-filled sac that forms on a Bartholin gland. Bartholin glands are small glands that are located within the folds of skin (labia) along the sides of the lower opening of the vagina. These glands produce a fluid to moisten the outside of the vagina during sexual intercourse. A Bartholin cyst causes a bulge on the side of the vagina. A cyst that is not large or infected may not cause symptoms or problems. However, if the fluid within the cyst becomes infected, the cyst can turn into an abscess. An abscess may cause discomfort or pain. CAUSES A Bartholin cyst may develop when the duct of the gland becomes blocked. In many cases, the cause of this is not known. Various kinds of bacteria can cause the cyst to become infected and develop into an abscess. RISK FACTORS You may be at an increased risk of developing a Bartholin cyst or abscess if:  You are a woman of reproductive age.  You have a history of previous Bartholin cysts or abscesses.  You have diabetes.  You have a sexually transmitted disease (STD). SIGNS AND SYMPTOMS The severity of symptoms varies depending on the size of the cyst and whether it is infected. Symptoms may include:  A bulge or swelling near the lower opening of your vagina.  Discomfort or pain.  Redness.  Pain during sexual intercourse.  Pain when walking.  Fluid draining from the area. DIAGNOSIS Your health care provider may make a diagnosis based on your symptoms and a physical exam. He or she will look for swelling in your vaginal area. Blood tests may be done to check for infections. A sample of fluid from the cyst or abscess may also be taken to be tested in a lab. TREATMENT Small cysts that are not infected may not require any treatment. These often go away on their own. Yourhealth care provider will recommend hot baths and the use of warm compresses. These may also be part of the treatment for an abscess.  Treatment options for a large cyst or abscess may include:   Antibiotic medicine.  A surgical procedure to drain the abscess. One of the following procedures may be done:  Incision and drainage. An incision is made in the cyst or abscess so that the fluid drains out. A catheter may be placed inside the cyst so that it does not close and fill up with fluid again. The catheter will be removed after you have a follow-up visit with a specialist (gynecologist).  Marsupialization. The cyst or abscess is opened and kept open by stitching the edges of the skin to the walls of the cyst or abscess. This allows it to continue to drain and not fill up with fluid again. If you have cysts or abscesses that keep returning and have required incision and drainage multiple times, your health care provider may talk to you about surgery to remove the Bartholin gland. HOME CARE INSTRUCTIONS  Take medicines only as directed by your health care provider.  If you were prescribed an antibiotic medicine, finish it all even if you start to feel better.  Apply warm, wet compresses to the area or take warm, shallow baths that cover your pelvic region (sitz baths) several times a day or as directed by your health care provider.  Do not squeeze the cyst or apply heavy pressure to it.  Do not have sexual intercourse until the cyst has gone away.  If your cyst or abscess was   opened, a small piece of gauze or a drain may have been placed in the area to allow drainage. Do not remove the gauze or the drain until directed by your health care provider.  Wear feminine pads--not tampons--as needed for any drainage or bleeding.  Keep all follow-up visits as directed by your health care provider. This is important. PREVENTION Take these steps to help prevent a Bartholin cyst from returning:  Practice good hygiene.   Clean your vaginal area with mild soap and a soft cloth when you bathe.  Practice safe sex to prevent  STDs. SEEK MEDICAL CARE IF:  You have increased pain, swelling, or redness in the area of the cyst.  Puslike drainage is coming from the cyst.  You have a fever.   This information is not intended to replace advice given to you by your health care provider. Make sure you discuss any questions you have with your health care provider.   Document Released: 08/30/2005 Document Revised: 09/20/2014 Document Reviewed: 04/15/2014 Elsevier Interactive Patient Education [employer].

## 2016-02-07 NOTE — MAU Provider Note (Signed)
Chief Complaint: Vaginal Pain   First Provider Initiated Contact with Patient 02/07/16 2123      SUBJECTIVE HPI: Brandy Howard is a 20 y.o. G0P0000 who presents to maternity admissions reporting right labial cyst with painful onset today.  She has not tried anything for the constant burning pain. 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 She denies vaginal bleeding, vaginal itching/burning, urinary symptoms, h/a, dizziness, n/v, or fever/chills.     HPI  Past Medical History  Diagnosis Date  . Asthma   . Anemia   . Anxiety   . Seasonal allergies    History reviewed. No pertinent past surgical history. Social History   Social History  . Marital Status: Single    Spouse Name: N/A  . Number of Children: N/A  . Years of Education: N/A   Occupational History  . Not on file.   Social History Main Topics  . Smoking status: Never Smoker   . Smokeless tobacco: Never Used  . Alcohol Use: No  . Drug Use: No  . Sexual Activity: Yes    Birth Control/ Protection: Condom   Other Topics Concern  . Not on file   Social History Narrative   No current facility-administered medications on file prior to encounter.   Current Outpatient Prescriptions on File Prior to Encounter  Medication Sig Dispense Refill  . benzocaine-Menthol (DERMOPLAST) 20-0.5 % AERO Apply 1 application topically 4 (four) times daily as needed for irritation. 56 g 2  . fluconazole (DIFLUCAN) 150 MG tablet Take 1 tablet (150 mg total) by mouth once. 1 tablet 0  . ibuprofen (ADVIL,MOTRIN) 600 MG tablet Take 1 tablet (600 mg total) by mouth once. (Patient not taking: Reported on 10/01/2015) 30 tablet 0  . Norethin-Eth Estradiol-Fe [facility] FE,WYMZYA Orrin Brigham) 0.4-35 MG-MCG tablet Chew 1 tablet by mouth daily. 1 Package 11  . valACYclovir (VALTREX) 1000 MG  tablet TAKE 1 TABLET BY MOUTH DAILY (Patient not taking: Reported on 10/01/2015) 30 tablet 6   Allergies  Allergen Reactions  . Dust Mite Extract Itching    ROS:  Review of Systems  Constitutional: Negative for fever, chills and fatigue.  Respiratory: Negative for shortness of breath.   Cardiovascular: Negative for chest pain.  Genitourinary: Positive for vaginal discharge, genital sores and vaginal pain. Negative for dysuria, flank pain, difficulty urinating and pelvic pain.  Neurological: Negative for dizziness and headaches.  Psychiatric/Behavioral: Negative.      I have reviewed patient's Past Medical Hx, Surgical Hx, Family Hx, Social Hx, medications and allergies.   Physical Exam  Patient Vitals for the past 24 hrs:  BP Temp Pulse Resp Height Weight  02/07/16 2022 117/77 mmHg 98.2 F (36.8 C) 86 18 5\' 3"  (1.6 m) 108 lb (48.988 kg)   Constitutional: Well-developed, well-nourished female in no acute distress.  Cardiovascular: normal rate Respiratory: normal effort GI: Abd soft, non-tender. Pos BS x 4 MS: Extremities nontender, no edema, normal ROM Neurologic: Alert and oriented x 4.  GU: Neg CVAT.  PELVIC EXAM: Visual inspection reveals 3 cm soft round raised area of right labia majora, fluctuant and painful to palpation  LAB RESULTS Results for orders placed or performed during the hospital encounter of 02/07/16 (from the past 24 hour(s))  Urinalysis, Routine w reflex microscopic (not at [facility])     Status:  Abnormal   Collection Time: 02/07/16  8:30 PM  Result Value Ref Range   Color, Urine YELLOW YELLOW   APPearance CLEAR CLEAR   Specific Gravity, Urine <1.005 (L) 1.005 - 1.030   pH 6.0 5.0 - 8.0   Glucose, UA NEGATIVE NEGATIVE mg/dL   Hgb urine dipstick LARGE (A) NEGATIVE   Bilirubin Urine NEGATIVE NEGATIVE   Ketones, ur NEGATIVE NEGATIVE mg/dL   Protein, ur NEGATIVE NEGATIVE mg/dL   Nitrite NEGATIVE NEGATIVE   Leukocytes, UA LARGE (A) NEGATIVE  Urine  microscopic-add on     Status: Abnormal   Collection Time: 02/07/16  8:30 PM  Result Value Ref Range   Squamous Epithelial / LPF 0-5 (A) NONE SEEN   WBC, UA 6-30 0 - 5 WBC/hpf   RBC / HPF TOO NUMEROUS TO COUNT 0 - 5 RBC/hpf   Bacteria, UA FEW (A) NONE SEEN  Pregnancy, urine POC     Status: None   Collection Time: 02/07/16  8:32 PM  Result Value Ref Range   Preg Test, Ur NEGATIVE NEGATIVE  Wet prep, genital     Status: Abnormal   Collection Time: 02/07/16  9:25 PM  Result Value Ref Range   Yeast Wet Prep HPF POC NONE SEEN NONE SEEN   Trich, Wet Prep NONE SEEN NONE SEEN   Clue Cells Wet Prep HPF POC NONE SEEN NONE SEEN   WBC, Wet Prep HPF POC MODERATE (A) NONE SEEN   Sperm NONE SEEN     Labial Cyst I&D  Enlarged abscess palpated on right labia.  Written informed consent was obtained.  Discussed complications and possible outcomes of procedure including recurrence of cyst, scarring leading to infecton, bleeding, dyspareunia, distortion of anatomy.  Patient was examined in the dorsal lithotomy position and mass was identified.  The area was prepped with Iodine and draped in a sterile manner. 1% Lidocaine (3 ml) was then used to infiltrate area on top of the cyst, behind the hymenal ring.  A 7 mm incision was made using a sterile scapel. Upon palpation of the mass, a moderate amount of bloody purulent drainage was expressed through the incision. A hemostat was used to break up loculations, which resulted in expression of more bloody purulent drainage. Patient tolerated the procedure well, reported feeling " a lot better." - Bactrim DS bid x 7 days for treatment - Recommended Sitz baths bid and Motrin and Percocet was given  prn pain.   She was told to call to be examined if she experiences increasing swelling, pain, vaginal discharge, or fever.  - She was instructed to wear a peripad to absorb discharge.     MAU Management/MDM: Ordered labs and reviewed results.   Pt feels better after I&D  procedure.  D/C home with abx and pain medication, see above.  Pt stable at time of discharge.  ASSESSMENT 1. Vulvar cyst     PLAN Discharge home    Medication List    STOP taking these medications        valACYclovir 1000 MG tablet  Commonly known as:  VALTREX      TAKE these medications        benzocaine-Menthol 20-0.5 % Aero  Commonly known as:  DERMOPLAST  Apply 1 application topically 4 (four) times daily as needed for irritation.     fluconazole 150 MG tablet  Commonly known as:  DIFLUCAN  Take 1 tablet (150 mg total) by mouth once.     ibuprofen 600 MG tablet  Commonly known as:  ADVIL,MOTRIN  Take 1 tablet (600 mg total) by mouth once.     Norethin-Eth Estradiol-Fe 0.4-35 MG-MCG tablet  Commonly known as:  FEMCON FE,WYMZYA FE,ZENCHENT FE,ZEOSA  Chew 1 tablet by mouth daily.     oxyCODONE-acetaminophen 5-325 MG tablet  Commonly known as:  PERCOCET/ROXICET  Take 1-2 tablets by mouth every 6 (six) hours as needed for severe pain.     sulfamethoxazole-trimethoprim 800-160 MG tablet  Commonly known as:  BACTRIM DS,SEPTRA DS  Take 1 tablet by mouth 2 (two) times daily.           Follow-up Information    Follow up with [facility].   Specialty:  Obstetrics and Gynecology   Why:  As needed, Return to MAU as needed for emergencies   Contact information:   [place] [place] Certified Nurse-Midwife 02/07/2016  11:26 PM

## 2016-02-07 NOTE — MAU Note (Signed)
Few months ago had swelling in vaginal area between vagina and rectum. Seen at [facility] and told would go away. Swelling came back last wk and getting worse. Some white vag d/c

## 2016-02-07 NOTE — Progress Notes (Signed)
WRitten and verbal d/c instructions given and understanding voiced.  

## 2016-02-08 LAB — RPR: RPR: NONREACTIVE

## 2016-02-08 LAB — HIV ANTIBODY (ROUTINE TESTING W REFLEX): HIV Screen 4th Generation wRfx: NONREACTIVE

## 2016-02-10 LAB — GC/CHLAMYDIA PROBE AMP (~~LOC~~) NOT AT ARMC
Chlamydia: POSITIVE — AB
NEISSERIA GONORRHEA: NEGATIVE

## 2016-02-12 ENCOUNTER — Encounter (HOSPITAL_COMMUNITY): Payer: Self-pay | Admitting: *Deleted

## 2016-02-17 ENCOUNTER — Telehealth (HOSPITAL_COMMUNITY): Payer: Self-pay | Admitting: *Deleted

## 2016-02-17 DIAGNOSIS — A749 Chlamydial infection, unspecified: Secondary | ICD-10-CM

## 2016-02-17 MED ORDER — AZITHROMYCIN 500 MG PO TABS: ORAL_TABLET | ORAL | Status: DC

## 2016-02-17 NOTE — Telephone Encounter (Signed)
Patient returned call, notified her of positive chlamydia culture.  Rx routed to pharmacy.  Instructed patient to notify her partner for treatment and to abstain from sex for seven days post treatment.

## 2016-08-24 ENCOUNTER — Encounter (HOSPITAL_COMMUNITY): Payer: Self-pay | Admitting: *Deleted

## 2016-08-24 ENCOUNTER — Inpatient Hospital Stay (HOSPITAL_COMMUNITY)
Admission: AD | Admit: 2016-08-24 | Discharge: 2016-08-24 | Disposition: A | Discharge status: Home / Self Care | Payer: Medicaid Other | Source: Ambulatory Visit | Attending: Obstetrics and Gynecology | Admitting: Obstetrics and Gynecology

## 2016-08-24 DIAGNOSIS — A6000 Herpesviral infection of urogenital system, unspecified: Secondary | ICD-10-CM

## 2016-08-24 DIAGNOSIS — Z87891 Personal history of nicotine dependence: Secondary | ICD-10-CM | POA: Insufficient documentation

## 2016-08-24 DIAGNOSIS — Z3202 Encounter for pregnancy test, result negative: Secondary | ICD-10-CM | POA: Insufficient documentation

## 2016-08-24 DIAGNOSIS — L249 Irritant contact dermatitis, unspecified cause: Secondary | ICD-10-CM | POA: Insufficient documentation

## 2016-08-24 HISTORY — DX: Herpesviral infection of urogenital system, unspecified: A60.00

## 2016-08-24 HISTORY — DX: Depression, unspecified: F32.A

## 2016-08-24 HISTORY — DX: Dermatitis, unspecified: L30.9

## 2016-08-24 HISTORY — DX: Major depressive disorder, single episode, unspecified: F32.9

## 2016-08-24 LAB — WET PREP, GENITAL
Clue Cells Wet Prep HPF POC: NONE SEEN
Sperm: NONE SEEN
Trich, Wet Prep: NONE SEEN
YEAST WET PREP: NONE SEEN

## 2016-08-24 LAB — URINALYSIS, ROUTINE W REFLEX MICROSCOPIC
BILIRUBIN URINE: NEGATIVE
Glucose, UA: NEGATIVE mg/dL
KETONES UR: NEGATIVE mg/dL
Nitrite: NEGATIVE
PROTEIN: 30 mg/dL — AB
Specific Gravity, Urine: 1.013 (ref 1.005–1.030)
pH: 5 (ref 5.0–8.0)

## 2016-08-24 LAB — POCT PREGNANCY, URINE: Preg Test, Ur: NEGATIVE

## 2016-08-24 MED ORDER — LIDOCAINE HCL 2 % EX GEL: 1.0000 "application " | Freq: Three times a day (TID) | CUTANEOUS | 0 refills | Status: DC | PRN

## 2016-08-24 MED ORDER — OXYCODONE-ACETAMINOPHEN 5-325 MG PO TABS: 1.0000 | ORAL_TABLET | Freq: Four times a day (QID) | ORAL | 0 refills | Status: DC | PRN

## 2016-08-24 MED ORDER — ACYCLOVIR 200 MG PO CAPS: 800.0000 mg | ORAL_CAPSULE | Freq: Two times a day (BID) | ORAL | 11 refills | Status: DC

## 2016-08-24 MED ORDER — LIDOCAINE HCL 2 % EX GEL
1.0000 "application " | Freq: Once | CUTANEOUS | Status: AC
  Administered 2016-08-24: 1 via TOPICAL
  Filled 2016-08-24: qty 5

## 2016-08-24 NOTE — Discharge Instructions (Signed)
Genital Herpes Genital herpes is a common sexually transmitted infection (STI) that is caused by a virus. The virus is spread from person to person through sexual contact. Infection can cause itching, blisters, and sores in the genital area or rectal area. This is called an outbreak. It affects both men and women. Genital herpes is particularly concerning for pregnant women because the virus can be passed to the baby during delivery and cause serious problems. Genital herpes is also a concern for people with a weakened defense (immune) system. Symptoms of genital herpes may last several days and then go away. However, the virus remains in your body, so you may have more outbreaks of symptoms in the future. The time between outbreaks varies and can be months or years. CAUSES Genital herpes is caused by a virus called herpes simplex virus (HSV) type 2 or HSV type 1. These viruses are contagious and are most often spread through sexual contact with an infected person. Sexual contact includes vaginal, anal, and oral sex. RISK FACTORS Risk factors for genital herpes include:  Being sexually active with multiple partners.  Having unprotected sex. SIGNS AND SYMPTOMS Symptoms may include:  Pain and itching in the genital area or rectal area.  Small red bumps that turn into blisters and then turn into sores.  Flu-like symptoms, including:  Fever.  Body aches.  Painful urination.  Vaginal discharge. DIAGNOSIS Genital herpes may be diagnosed by:  Physical exam.  Blood test.  Fluid culture test from an open sore. TREATMENT There is no cure for genital herpes. Oral antiviral medicines may be used to speed up healing and to help prevent the return of symptoms. These medicines can also help to reduce the spread of the virus to sexual partners. HOME CARE INSTRUCTIONS  Keep the affected areas dry and clean.  Take medicines only as directed by your health care provider.  Do not have sexual  contact during active infections. Genital herpes is contagious.  Practice safe sex. Latex condoms and female condoms may help to prevent the spread of the herpes virus.  Avoid rubbing or touching the blisters and sores. If you do touch the blister or sores:  Wash your hands thoroughly.  Do not touch your eyes afterward.  If you become pregnant, tell your health care provider if you have had genital herpes.  Keep all follow-up visits as directed by your health care provider. This is important. PREVENTION  Use condoms. Although anyone can contract genital herpes during sexual contact even with the use of a condom, a condom can provide some protection.  Avoid having multiple sexual partners.  Talk to your sexual partner about any symptoms and past history that either of you may have.  Get tested before you have sex. Ask your partner to do the same.  Recognize the symptoms of genital herpes. Do not have sexual contact if you notice these symptoms. SEEK MEDICAL CARE IF:  Your symptoms are not improving with medicine.  Your symptoms return.  You have new symptoms.  You have a fever.  You have abdominal pain.  You have redness, swelling, or pain in your eye. MAKE SURE YOU:  Understand these instructions.  Will watch your condition.  Will get help right away if you are not doing well or get worse. This information is not intended to replace advice given to you by your health care provider. Make sure you discuss any questions you have with your health care provider. Document Released: 08/27/2000 Document Revised: 09/20/2014 Document  Reviewed: 01/15/2014 Elsevier Interactive Patient Education  2017 [place] Dermatitis Introduction Dermatitis is redness, soreness, and swelling (inflammation) of the skin. Contact dermatitis is a reaction to certain substances that touch the skin. There are two types of contact dermatitis:  Irritant contact dermatitis. This  type is caused by something that irritates your skin, such as dry hands from washing them too much. This type does not require previous exposure to the substance for a reaction to occur. This type is more common.  Allergic contact dermatitis. This type is caused by a substance that you are allergic to, such as a nickel allergy or poison ivy. This type only occurs if you have been exposed to the substance (allergen) before. Upon a repeat exposure, your body reacts to the substance. This type is less common. What are the causes? Many different substances can cause contact dermatitis. Irritant contact dermatitis is most commonly caused by exposure to:  Makeup.  Soaps.  Detergents.  Bleaches.  Acids.  Metal salts, such as nickel. Allergic contact dermatitis is most commonly caused by exposure to:  Poisonous plants.  Chemicals.  Jewelry.  Latex.  Medicines.  Preservatives in products, such as clothing. What increases the risk? This condition is more likely to develop in:  People who have jobs that expose them to irritants or allergens.  People who have certain medical conditions, such as asthma or eczema. What are the signs or symptoms? Symptoms of this condition may occur anywhere on your body where the irritant has touched you or is touched by you. Symptoms include:  Dryness or flaking.  Redness.  Cracks.  Itching.  Pain or a burning feeling.  Blisters.  Drainage of small amounts of blood or clear fluid from skin cracks. With allergic contact dermatitis, there may also be swelling in areas such as the eyelids, mouth, or genitals. How is this diagnosed? This condition is diagnosed with a medical history and physical exam. A patch skin test may be performed to help determine the cause. If the condition is related to your job, you may need to see an occupational medicine specialist. How is this treated? Treatment for this condition includes figuring out what caused  the reaction and protecting your skin from further contact. Treatment may also include:  Steroid creams or ointments. Oral steroid medicines may be needed in more severe cases.  Antibiotics or antibacterial ointments, if a skin infection is present.  Antihistamine lotion or an antihistamine taken by mouth to ease itching.  A bandage (dressing). Follow these instructions at home: [place] your skin as needed.  Apply cool compresses to the affected areas.  Try taking a bath with:  Epsom salts. Follow the instructions on the packaging. You can get these at your local pharmacy or grocery store.  Baking soda. Pour a small amount into the bath as directed by your health care provider.  Colloidal oatmeal. Follow the instructions on the packaging. You can get this at your local pharmacy or grocery store.  Try applying baking soda paste to your skin. Stir water into baking soda until it reaches a paste-like consistency.  Do not scratch your skin.  Bathe less frequently, such as every other day.  Bathe in lukewarm water. Avoid using hot water. Medicines  Take or apply over-the-counter and prescription medicines only as told by your health care provider.  If you were prescribed an antibiotic medicine, take or apply your antibiotic as told by your health care provider. Do not  stop using the antibiotic even if your condition starts to improve. General instructions  Keep all follow-up visits as told by your health care provider. This is important.  Avoid the substance that caused your reaction. If you do not know what caused it, keep a journal to try to track what caused it. Write down:  What you eat.  What cosmetic products you use.  What you drink.  What you wear in the affected area. This includes jewelry.  If you were given a dressing, take care of it as told by your health care provider. This includes when to change and remove it. Contact a health care provider  if:  Your condition does not improve with treatment.  Your condition gets worse.  You have signs of infection such as swelling, tenderness, redness, soreness, or warmth in the affected area.  You have a fever.  You have new symptoms. Get help right away if:  You have a severe headache, neck pain, or neck stiffness.  You vomit.  You feel very sleepy.  You notice red streaks coming from the affected area.  Your bone or joint underneath the affected area becomes painful after the skin has healed.  The affected area turns darker.  You have difficulty breathing. This information is not intended to replace advice given to you by your health care provider. Make sure you discuss any questions you have with your health care provider. Document Released: 08/27/2000 Document Revised: 02/05/2016 Document Reviewed: 01/15/2015  2017 Elsevier

## 2016-08-24 NOTE — MAU Note (Signed)
Thinks she is having an outbreak.  Tested + 2 yrs ago.  Does not have meds. Lesions on perineum , rt and left labia. Some have cracked open and are bleeding.  Also has a yellowish green slimy d/c, a couple days ago it was like cottage cheese

## 2016-08-24 NOTE — MAU Provider Note (Signed)
Chief Complaint: outbreak   First Provider Initiated Contact with Patient 08/24/16 [phone]      SUBJECTIVE HPI: Brandy Howard is a 20 y.o. G0P0000 who presents to maternity admissions reporting vaginal/perineal swelling and irritation x 1 week, then onset of vaginal discharge, itching, and vaginal pain with possible HSV outbreak x 3 days.  She has had HSV outbreaks before but this seems different and worse with the increased white vaginal discharge. She has not tried any treatments.  Nothing makes her symptoms better.  Walking makes her vaginal pain worse.   She denies vaginal bleeding, urinary symptoms, h/a, dizziness, n/v, or fever/chills.     HPI  Past Medical History:  Diagnosis Date  . Anemia   . Anxiety   . Asthma   . Depression    doing ok  . Eczema   . Genital herpes   . Seasonal allergies    Past Surgical History:  Procedure Laterality Date  . NO PAST SURGERIES     Social History   Social History  . Marital status: Single    Spouse name: N/A  . Number of children: N/A  . Years of education: N/A   Occupational History  . Not on file.   Social History Main Topics  . Smoking status: Former Smoker    Types: Cigars  . Smokeless tobacco: Never Used     Comment: April 2017  . Alcohol use No  . Drug use: No  . Sexual activity: Yes    Birth control/ protection: Condom   Other Topics Concern  . Not on file   Social History Narrative  . No narrative on file   No current facility-administered medications on file prior to encounter.    Current Outpatient Prescriptions on File Prior to Encounter  Medication Sig Dispense Refill  . azithromycin (ZITHROMAX) 500 MG tablet Take two tablets by mouth once. (Patient not taking: Reported on 08/24/2016) 2 tablet 0  . benzocaine-Menthol (DERMOPLAST) 20-0.5 % AERO Apply 1 application topically 4 (four) times daily as needed for irritation. (Patient not taking: Reported on 08/24/2016) 56 g 2  . fluconazole (DIFLUCAN) 150 MG  tablet Take 1 tablet (150 mg total) by mouth once. (Patient not taking: Reported on 08/24/2016) 1 tablet 0  . ibuprofen (ADVIL,MOTRIN) 600 MG tablet Take 1 tablet (600 mg total) by mouth once. (Patient not taking: Reported on 08/24/2016) 30 tablet 0  . Norethin-Eth Estradiol-Fe [facility] FE,WYMZYA Orrin Brigham) 0.4-35 MG-MCG tablet Chew 1 tablet by mouth daily. (Patient not taking: Reported on 08/24/2016) 1 Package 11  . oxyCODONE-acetaminophen (PERCOCET/ROXICET) 5-325 MG tablet Take 1-2 tablets by mouth every 6 (six) hours as needed for severe pain. (Patient not taking: Reported on 08/24/2016) 6 tablet 0   Allergies  Allergen Reactions  . Dust Mite Extract Itching    ROS:  Review of Systems  Constitutional: Negative for chills, fatigue and fever.  Respiratory: Negative for shortness of breath.   Cardiovascular: Negative for chest pain.  Genitourinary: Positive for vaginal discharge and vaginal pain. Negative for difficulty urinating, dysuria, flank pain, pelvic pain and vaginal bleeding.  Neurological: Negative for dizziness and headaches.  Psychiatric/Behavioral: Negative.      I have reviewed patient's Past Medical Hx, Surgical Hx, Family Hx, Social Hx, medications and allergies.   Physical Exam   Patient Vitals for the past 24 hrs:  BP Temp Temp src Pulse Resp Weight  08/24/16 0933 111/76 98.3 F (36.8 C) Oral 84 16 113 lb 12.8 oz (51.6 kg)  Constitutional: Well-developed, well-nourished female in no acute distress.  Cardiovascular: normal rate Respiratory: normal effort GI: Abd soft, non-tender. Pos BS x 4 MS: Extremities nontender, no edema, normal ROM Neurologic: Alert and oriented x 4.  GU: Neg CVAT.  PELVIC EXAM: Wet prep and GCC collected by blind swab by RN.  Visual inspection reveals 3-4 tiny flat white lesions that are painful to touch all in perineal and perianal area.  Also, discoloration of skin to lighter than pt skin tone in perineum and labia with  mild edema and erythema of periurethral, vulvar, and perineal areas.    LAB RESULTS Results for orders placed or performed during the hospital encounter of 08/24/16 (from the past 24 hour(s))  Urinalysis, Routine w reflex microscopic     Status: Abnormal   Collection Time: 08/24/16 10:34 AM  Result Value Ref Range   Color, Urine YELLOW YELLOW   APPearance HAZY (A) CLEAR   Specific Gravity, Urine 1.013 1.005 - 1.030   pH 5.0 5.0 - 8.0   Glucose, UA NEGATIVE NEGATIVE mg/dL   Hgb urine dipstick SMALL (A) NEGATIVE   Bilirubin Urine NEGATIVE NEGATIVE   Ketones, ur NEGATIVE NEGATIVE mg/dL   Protein, ur 30 (A) NEGATIVE mg/dL   Nitrite NEGATIVE NEGATIVE   Leukocytes, UA LARGE (A) NEGATIVE   RBC / HPF 6-30 0 - 5 RBC/hpf   WBC, UA 6-30 0 - 5 WBC/hpf   Bacteria, UA RARE (A) NONE SEEN   Squamous Epithelial / LPF 0-5 (A) NONE SEEN   Mucous PRESENT    Hyaline Casts, UA PRESENT   Pregnancy, urine POC     Status: None   Collection Time: 08/24/16 10:35 AM  Result Value Ref Range   Preg Test, Ur NEGATIVE NEGATIVE  Wet prep, genital     Status: Abnormal   Collection Time: 08/24/16 11:21 AM  Result Value Ref Range   Yeast Wet Prep HPF POC NONE SEEN NONE SEEN   Trich, Wet Prep NONE SEEN NONE SEEN   Clue Cells Wet Prep HPF POC NONE SEEN NONE SEEN   WBC, Wet Prep HPF POC MODERATE (A) NONE SEEN   Sperm NONE SEEN        IMAGING No results found.  MAU Management/MDM: Ordered labs and reviewed results.  Exam c/w recurrent HSV outbreak and contact dermatitis, likely from latex condoms.  Recommend no intercourse until outbreak resolved.  Topical lidocaine jelly and Percocet 5/325, take 1-2 Q 6 hours PRN x 10 tabs for pain.  Acyclovir 800 mg BID x 5 days for recurrent outbreak with refills.  F/U in [facility] for routine care/follow up or return to MAU for emergencies.  Pt should use non-latex synthetic condoms for STD prevention and continue to practice safe sex.  Pt stable at time of  discharge.  ASSESSMENT 1. Recurrent genital herpes simplex   2. Irritant contact dermatitis, unspecified trigger     PLAN Discharge home   Medication List    STOP taking these medications   azithromycin 500 MG tablet Commonly known as:  ZITHROMAX   benzocaine-Menthol 20-0.5 % Aero Commonly known as:  DERMOPLAST   fluconazole 150 MG tablet Commonly known as:  DIFLUCAN   ibuprofen 600 MG tablet Commonly known as:  ADVIL,MOTRIN   Norethin-Eth Estradiol-Fe 0.4-35 MG-MCG tablet Commonly known as:  FEMCON FE,WYMZYA FE,ZENCHENT FE,ZEOSA     TAKE these medications   acyclovir 200 MG capsule Commonly known as:  ZOVIRAX Take 4 capsules (800 mg total) by mouth 2 (two) times  daily.   lidocaine 2 % jelly Commonly known as:  XYLOCAINE Apply 1 application topically 3 (three) times daily as needed.   naproxen sodium 220 MG tablet Commonly known as:  ANAPROX Take 220 mg by mouth as needed. pain   oxyCODONE-acetaminophen 5-325 MG tablet Commonly known as:  PERCOCET/ROXICET Take 1-2 tablets by mouth every 6 (six) hours as needed. What changed:  reasons to take this      [facility] Follow up.   Specialty:  Obstetrics and Gynecology Why:  For routine Gyn care or follow up. Return to MAU as needed for emergencies. Contact information: [place] Certified Nurse-Midwife 08/24/2016  12:02 PM

## 2016-08-25 LAB — URINE CULTURE

## 2016-08-25 LAB — GC/CHLAMYDIA PROBE AMP (~~LOC~~) NOT AT ARMC
CHLAMYDIA, DNA PROBE: NEGATIVE
Neisseria Gonorrhea: NEGATIVE

## 2016-08-26 ENCOUNTER — Encounter (HOSPITAL_COMMUNITY): Payer: Self-pay | Admitting: *Deleted

## 2016-10-13 ENCOUNTER — Inpatient Hospital Stay (HOSPITAL_COMMUNITY)
Admission: AD | Admit: 2016-10-13 | Discharge: 2016-10-13 | Disposition: A | Discharge status: Home / Self Care | Payer: Medicaid Other | Source: Ambulatory Visit | Attending: Obstetrics and Gynecology | Admitting: Obstetrics and Gynecology

## 2016-10-13 ENCOUNTER — Encounter (HOSPITAL_COMMUNITY): Payer: Self-pay

## 2016-10-13 DIAGNOSIS — B9789 Other viral agents as the cause of diseases classified elsewhere: Secondary | ICD-10-CM

## 2016-10-13 DIAGNOSIS — A6009 Herpesviral infection of other urogenital tract: Secondary | ICD-10-CM

## 2016-10-13 DIAGNOSIS — J069 Acute upper respiratory infection, unspecified: Secondary | ICD-10-CM

## 2016-10-13 DIAGNOSIS — E860 Dehydration: Secondary | ICD-10-CM | POA: Insufficient documentation

## 2016-10-13 DIAGNOSIS — A6000 Herpesviral infection of urogenital system, unspecified: Secondary | ICD-10-CM | POA: Insufficient documentation

## 2016-10-13 DIAGNOSIS — J101 Influenza due to other identified influenza virus with other respiratory manifestations: Secondary | ICD-10-CM

## 2016-10-13 DIAGNOSIS — J111 Influenza due to unidentified influenza virus with other respiratory manifestations: Secondary | ICD-10-CM | POA: Insufficient documentation

## 2016-10-13 LAB — URINALYSIS, ROUTINE W REFLEX MICROSCOPIC
Bilirubin Urine: NEGATIVE
GLUCOSE, UA: NEGATIVE mg/dL
HGB URINE DIPSTICK: NEGATIVE
Ketones, ur: 15 mg/dL — AB
Nitrite: NEGATIVE
PH: 5.5 (ref 5.0–8.0)
Protein, ur: NEGATIVE mg/dL

## 2016-10-13 LAB — POCT PREGNANCY, URINE: PREG TEST UR: NEGATIVE

## 2016-10-13 LAB — INFLUENZA PANEL BY PCR (TYPE A & B)
Influenza A By PCR: POSITIVE — AB
Influenza B By PCR: NEGATIVE

## 2016-10-13 LAB — URINALYSIS, MICROSCOPIC (REFLEX)

## 2016-10-13 MED ORDER — ACETAMINOPHEN 500 MG PO TABS
1000.0000 mg | ORAL_TABLET | Freq: Four times a day (QID) | ORAL | Status: DC | PRN
  Administered 2016-10-13: 1000 mg via ORAL
  Filled 2016-10-13: qty 2

## 2016-10-13 MED ORDER — LIDOCAINE HCL 2 % EX GEL: 1.0000 "application " | Freq: Three times a day (TID) | CUTANEOUS | 1 refills | Status: DC | PRN

## 2016-10-13 MED ORDER — OSELTAMIVIR PHOSPHATE 75 MG PO CAPS: 75.0000 mg | ORAL_CAPSULE | Freq: Two times a day (BID) | ORAL | 0 refills | Status: AC

## 2016-10-13 NOTE — MAU Note (Signed)
Last Thursday, had an outbreak- near anus, every time she would have a BM it would like split, also "split" between anus and vagina.  Has a lump in pubic area- noted about a wk ago.  Passed out this morning, has been dizzy every since. Been nauseated.  No energy, has a migraine, no appetite. Sore throat, cough

## 2016-10-13 NOTE — MAU Provider Note (Signed)
History     CSN: [id number]  Arrival date and time: 10/13/16 1825   First Provider Initiated Contact with Patient 10/13/16 1904      Chief Complaint  Patient presents with  . outbreak  . Nausea  . Dizziness  . no appetite  . Headache   Non-pregnant female here with herpes outbreak 6 days ago. She felt a bump on the right side. She reports seeing lesions around the anus and cracking. She saw clear drainage from the anal area. She has used vasaline to the area and reports some improvement. No vaginal discharge. No new partner. She also c/o cough x3 days. She reports production of yellow mucous. She also c/o migraine HA x2 days. She reports hot and cold 1 week ago but has not checked temperature. Review of chart reveals last outbreak was last month and she was given Rx for Acyclovir with refills but didn't have the money.    Pertinent Gynecological History: Menses: 10/01/16 Contraception: condoms Sexually transmitted diseases: past history: CT and HSV   Past Medical History:  Diagnosis Date  . Anemia   . Anxiety   . Asthma   . Depression    doing ok  . Eczema   . Genital herpes   . Seasonal allergies     Past Surgical History:  Procedure Laterality Date  . NO PAST SURGERIES      Family History  Problem Relation Age of Onset  . Adopted: Yes  . Family history unknown: Yes    Social History  Substance Use Topics  . Smoking status: Former Smoker    Types: Cigars  . Smokeless tobacco: Never Used     Comment: April 2017  . Alcohol use Yes     Comment: Social     Allergies:  Allergies  Allergen Reactions  . Dust Mite Extract Itching    Prescriptions Prior to Admission  Medication Sig Dispense Refill Last Dose  . acyclovir (ZOVIRAX) 200 MG capsule Take 4 capsules (800 mg total) by mouth 2 (two) times daily. 40 capsule 11   . lidocaine (XYLOCAINE) 2 % jelly Apply 1 application topically 3 (three) times daily as needed. 30 mL 0   . naproxen sodium (ANAPROX)  220 MG tablet Take 220 mg by mouth as needed. pain   08/24/2016 at Unknown time  . oxyCODONE-acetaminophen (PERCOCET/ROXICET) 5-325 MG tablet Take 1-2 tablets by mouth every 6 (six) hours as needed. 10 tablet 0     Review of Systems  Constitutional: Positive for appetite change.  HENT: Positive for congestion. Negative for sore throat.   Respiratory: Positive for cough. Negative for shortness of breath.   Gastrointestinal: Negative for nausea and vomiting.  Genitourinary: Positive for vaginal pain.  Neurological: Positive for dizziness and headaches.   Physical Exam   Blood pressure 116/69, pulse (!) 145, temperature 100.7 F (38.2 C), temperature source Oral, resp. rate 16, height 5\' 3"  (1.6 m), weight 49.7 kg (109 lb 9.6 oz), last menstrual period 10/01/2016, SpO2 99 %.  Physical Exam  Nursing note and vitals reviewed. Constitutional: She is oriented to person, place, and time. She appears well-developed and well-nourished. No distress.  HENT:  Head: Normocephalic.  Mouth/Throat: No oropharyngeal exudate, posterior oropharyngeal edema, posterior oropharyngeal erythema or tonsillar abscesses.  Neck: Normal range of motion.  Cardiovascular: Regular rhythm and normal heart sounds.   tachy  Respiratory: Effort normal and breath sounds normal. No respiratory distress.  Genitourinary:     Genitourinary Comments: superficial excoriations on perineum, perianal  and intergluteal cleft Small round ulceration surrounding anus  Musculoskeletal: Normal range of motion.  Neurological: She is alert and oriented to person, place, and time.  Skin: Skin is warm and dry.  Psychiatric: She has a normal mood and affect.   Results for orders placed or performed during the hospital encounter of 10/13/16 (from the past 24 hour(s))  Urinalysis, Routine w reflex microscopic     Status: Abnormal   Collection Time: 10/13/16  6:42 PM  Result Value Ref Range   Color, Urine YELLOW YELLOW   APPearance  CLEAR CLEAR   Specific Gravity, Urine <1.005 (L) 1.005 - 1.030   pH 5.5 5.0 - 8.0   Glucose, UA NEGATIVE NEGATIVE mg/dL   Hgb urine dipstick NEGATIVE NEGATIVE   Bilirubin Urine NEGATIVE NEGATIVE   Ketones, ur 15 (A) NEGATIVE mg/dL   Protein, ur NEGATIVE NEGATIVE mg/dL   Nitrite NEGATIVE NEGATIVE   Leukocytes, UA MODERATE (A) NEGATIVE  Urinalysis, Microscopic (reflex)     Status: Abnormal   Collection Time: 10/13/16  6:42 PM  Result Value Ref Range   RBC / HPF 0-5 0 - 5 RBC/hpf   WBC, UA 0-5 0 - 5 WBC/hpf   Bacteria, UA FEW (A) NONE SEEN   Squamous Epithelial / LPF 0-5 (A) NONE SEEN  Pregnancy, urine POC     Status: None   Collection Time: 10/13/16  6:54 PM  Result Value Ref Range   Preg Test, Ur NEGATIVE NEGATIVE  Influenza panel by PCR (type A & B)     Status: Abnormal   Collection Time: 10/13/16  7:30 PM  Result Value Ref Range   Influenza A By PCR POSITIVE (A) NEGATIVE   Influenza B By PCR NEGATIVE NEGATIVE   \ MAU Course  Procedures Tylenol 1gm po  MDM Labs ordered and reviewed. Will treat for HSV outbreak and upper resp virus. Stable for discharge home.  Assessment and Plan   1. Herpes simplex of female genitalia   2. Upper respiratory virus   3. Dehydration   4. Influenza A virus present    Discharge home Increase hydration Rest OOW today and tomorrow Tylenol prn Mucinex OTC Follow up with PCP if resp sx persist Follow up with [facility] if HSV persists  Allergies as of 10/13/2016      Reactions   Dust Mite Extract Itching      Medication List    STOP taking these medications   oxyCODONE-acetaminophen 5-325 MG tablet Commonly known as:  PERCOCET/ROXICET     TAKE these medications   acyclovir 200 MG capsule Commonly known as:  ZOVIRAX Take 4 capsules (800 mg total) by mouth 2 (two) times daily.   lidocaine 2 % jelly Commonly known as:  XYLOCAINE Apply 1 application topically 3 (three) times daily as needed.   NYQUIL PO Take 2 capsules by mouth at  bedtime as needed (For cold symptoms.).   oseltamivir 75 MG capsule Commonly known as:  TAMIFLU Take 1 capsule (75 mg total) by mouth 2 (two) times daily.      Julianne Handler, CNM 10/13/2016, 7:05 PM

## 2016-10-13 NOTE — Discharge Instructions (Signed)
Genital Herpes Genital herpes is a common sexually transmitted infection (STI) that is caused by a virus. The virus is spread from person to person through sexual contact. Infection can cause itching, blisters, and sores in the genital area or rectal area. This is called an outbreak. It affects both men and women. Genital herpes is particularly concerning for pregnant women because the virus can be passed to the baby during delivery and cause serious problems. Genital herpes is also a concern for people with a weakened defense (immune) system. Symptoms of genital herpes may last several days and then go away. However, the virus remains in your body, so you may have more outbreaks of symptoms in the future. The time between outbreaks varies and can be months or years. CAUSES Genital herpes is caused by a virus called herpes simplex virus (HSV) type 2 or HSV type 1. These viruses are contagious and are most often spread through sexual contact with an infected person. Sexual contact includes vaginal, anal, and oral sex. RISK FACTORS Risk factors for genital herpes include:  Being sexually active with multiple partners.  Having unprotected sex. SIGNS AND SYMPTOMS Symptoms may include:  Pain and itching in the genital area or rectal area.  Small red bumps that turn into blisters and then turn into sores.  Flu-like symptoms, including:  Fever.  Body aches.  Painful urination.  Vaginal discharge. DIAGNOSIS Genital herpes may be diagnosed by:  Physical exam.  Blood test.  Fluid culture test from an open sore. TREATMENT There is no cure for genital herpes. Oral antiviral medicines may be used to speed up healing and to help prevent the return of symptoms. These medicines can also help to reduce the spread of the virus to sexual partners. HOME CARE INSTRUCTIONS  Keep the affected areas dry and clean.  Take medicines only as directed by your health care provider.  Do not have sexual  contact during active infections. Genital herpes is contagious.  Practice safe sex. Latex condoms and female condoms may help to prevent the spread of the herpes virus.  Avoid rubbing or touching the blisters and sores. If you do touch the blister or sores:  Wash your hands thoroughly.  Do not touch your eyes afterward.  If you become pregnant, tell your health care provider if you have had genital herpes.  Keep all follow-up visits as directed by your health care provider. This is important. PREVENTION  Use condoms. Although anyone can contract genital herpes during sexual contact even with the use of a condom, a condom can provide some protection.  Avoid having multiple sexual partners.  Talk to your sexual partner about any symptoms and past history that either of you may have.  Get tested before you have sex. Ask your partner to do the same.  Recognize the symptoms of genital herpes. Do not have sexual contact if you notice these symptoms. SEEK MEDICAL CARE IF:  Your symptoms are not improving with medicine.  Your symptoms return.  You have new symptoms.  You have a fever.  You have abdominal pain.  You have redness, swelling, or pain in your eye. MAKE SURE YOU:  Understand these instructions.  Will watch your condition.  Will get help right away if you are not doing well or get worse. This information is not intended to replace advice given to you by your health care provider. Make sure you discuss any questions you have with your health care provider. Document Released: 08/27/2000 Document Revised: 09/20/2014 Document  Reviewed: 01/15/2014 Elsevier Interactive Patient Education  2017 [employer].  Upper Respiratory Infection, Adult Most upper respiratory infections (URIs) are caused by a virus. A URI affects the nose, throat, and upper air passages. The most common type of URI is often called "the common cold." Follow these instructions at home:  Take  medicines only as told by your doctor.  Gargle warm saltwater or take cough drops to comfort your throat as told by your doctor.  Use a warm mist humidifier or inhale steam from a shower to increase air moisture. This may make it easier to breathe.  Drink enough fluid to keep your pee (urine) clear or pale yellow.  Eat soups and other clear broths.  Have a healthy diet.  Rest as needed.  Go back to work when your fever is gone or your doctor says it is okay.  You may need to stay home longer to avoid giving your URI to others.  You can also wear a face mask and wash your hands often to prevent spread of the virus.  Use your inhaler more if you have asthma.  Do not use any tobacco products, including cigarettes, chewing tobacco, or electronic cigarettes. If you need help quitting, ask your doctor. Contact a doctor if:  You are getting worse, not better.  Your symptoms are not helped by medicine.  You have chills.  You are getting more short of breath.  You have brown or red mucus.  You have yellow or brown discharge from your nose.  You have pain in your face, especially when you bend forward.  You have a fever.  You have puffy (swollen) neck glands.  You have pain while swallowing.  You have white areas in the back of your throat. Get help right away if:  You have very bad or constant:  Headache.  Ear pain.  Pain in your forehead, behind your eyes, and over your cheekbones (sinus pain).  Chest pain.  You have long-lasting (chronic) lung disease and any of the following:  Wheezing.  Long-lasting cough.  Coughing up blood.  A change in your usual mucus.  You have a stiff neck.  You have changes in your:  Vision.  Hearing.  Thinking.  Mood. This information is not intended to replace advice given to you by your health care provider. Make sure you discuss any questions you have with your health care provider. Document Released: 02/16/2008  Document Revised: 05/02/2016 Document Reviewed: 12/05/2013 Elsevier Interactive Patient Education  2017 [employer].

## 2017-05-20 ENCOUNTER — Inpatient Hospital Stay (HOSPITAL_COMMUNITY)
Admission: AD | Admit: 2017-05-20 | Discharge: 2017-05-20 | Disposition: A | Discharge status: Home / Self Care | Payer: Self-pay | Source: Ambulatory Visit | Attending: Obstetrics and Gynecology | Admitting: Obstetrics and Gynecology

## 2017-05-20 ENCOUNTER — Encounter (HOSPITAL_COMMUNITY): Payer: Self-pay | Admitting: *Deleted

## 2017-05-20 DIAGNOSIS — N939 Abnormal uterine and vaginal bleeding, unspecified: Secondary | ICD-10-CM | POA: Insufficient documentation

## 2017-05-20 DIAGNOSIS — Z87891 Personal history of nicotine dependence: Secondary | ICD-10-CM | POA: Insufficient documentation

## 2017-05-20 DIAGNOSIS — D649 Anemia, unspecified: Secondary | ICD-10-CM | POA: Insufficient documentation

## 2017-05-20 LAB — URINALYSIS, ROUTINE W REFLEX MICROSCOPIC
BILIRUBIN URINE: NEGATIVE
GLUCOSE, UA: NEGATIVE mg/dL
KETONES UR: NEGATIVE mg/dL
LEUKOCYTES UA: NEGATIVE
Nitrite: NEGATIVE
PH: 5 (ref 5.0–8.0)
Protein, ur: 100 mg/dL — AB
Specific Gravity, Urine: 1.023 (ref 1.005–1.030)
WBC UA: NONE SEEN WBC/hpf (ref 0–5)

## 2017-05-20 LAB — CBC
HEMATOCRIT: 24.3 % — AB (ref 36.0–46.0)
Hemoglobin: 6.9 g/dL — CL (ref 12.0–15.0)
MCH: 18.3 pg — ABNORMAL LOW (ref 26.0–34.0)
MCHC: 28.4 g/dL — ABNORMAL LOW (ref 30.0–36.0)
MCV: 64.5 fL — AB (ref 78.0–100.0)
Platelets: 820 10*3/uL — ABNORMAL HIGH (ref 150–400)
RBC: 3.77 MIL/uL — ABNORMAL LOW (ref 3.87–5.11)
RDW: 21.6 % — AB (ref 11.5–15.5)
WBC: 5.5 10*3/uL (ref 4.0–10.5)

## 2017-05-20 LAB — HCG, SERUM, QUALITATIVE: Preg, Serum: NEGATIVE

## 2017-05-20 LAB — POCT PREGNANCY, URINE: Preg Test, Ur: NEGATIVE

## 2017-05-20 NOTE — MAU Note (Signed)
Vaginal bleeding started yesterday with a hand full of clots.  Clots today and yesterday. Last menstrual cycle started August 24th, ended August 29th. Pain 6/10, abdominal pains, also breast soreness. Pt. Did not self-medicate today for pain.

## 2017-05-20 NOTE — Discharge Instructions (Signed)
You can take Aleve (Naprosyn) 500 mg by mouth at onset of bleeding and 3-5 hours later, then 250 to 500 mg twice daily.  This should be started the first day of bleeding and continued for 4-5 days or until bleeding stops.

## 2017-05-20 NOTE — MAU Provider Note (Signed)
History     CSN: [id number]  Arrival date and time: 05/20/17 1541   First Provider Initiated Contact with Patient 05/20/17 1701      Chief Complaint  Patient presents with  . Vaginal Bleeding   HPI  [name] is a 21 y.o. non-pregnant female presenting to MAU with complaints of heavy vaginal bleeding with a "handful" of clots.  She reports her LMP was 05/06/2017 through 05/12/2017.  She was concerned she might be pregnant and miscarrying.  Past Medical History:  Diagnosis Date  . Anemia   . Anxiety   . Asthma   . Depression    doing ok  . Eczema   . Genital herpes   . Seasonal allergies     Past Surgical History:  Procedure Laterality Date  . NO PAST SURGERIES      Family History  Problem Relation Age of Onset  . Adopted: Yes  . Family history unknown: Yes    Social History  Substance Use Topics  . Smoking status: Former Smoker    Types: Cigars  . Smokeless tobacco: Never Used     Comment: April 2017  . Alcohol use Yes     Comment: Social     Allergies:  Allergies  Allergen Reactions  . Dust Mite Extract Itching    No prescriptions prior to admission.    Review of Systems  Constitutional: Negative.   HENT: Negative.   Eyes: Negative.   Respiratory: Negative.   Cardiovascular: Negative.   Gastrointestinal: Negative.   Endocrine: Negative.   Genitourinary: Positive for vaginal bleeding (with clots).  Musculoskeletal: Negative.   Skin: Negative.   Allergic/Immunologic: Negative.   Neurological: Negative.   Hematological: Negative.   Psychiatric/Behavioral: Negative.    Physical Exam   Blood pressure 99/74, pulse 78, resp. rate 18.  Physical Exam  Constitutional: She is oriented to person, place, and time. She appears well-developed and well-nourished.  HENT:  Head: Normocephalic.  Eyes: Pupils are equal, round, and reactive to light.  Neck: Normal range of motion.  GI: Soft. Bowel sounds are normal.  Genitourinary:   Genitourinary Comments: Uterus: non-tender, cx; smooth, pink, nulliparous, no lesions, small amt of dark, red blood, closed/long/firm, no CMT or friability, no adnexal tenderness  Musculoskeletal: Normal range of motion.  Neurological: She is alert and oriented to person, place, and time.  Skin: Skin is warm and dry.  Psychiatric: She has a normal mood and affect. Her behavior is normal. Judgment and thought content normal.    MAU Course  Procedures  MDM CCUA UPT HCG Qualitative CBC Pelvic Exam  *Consult with Dr. Rip Harbour @ 1800 - notified of patient's complaints, assessments, lab results, recommended tx plan Rx iron supplementation 1 tab po BID - agrees with plan  Results for orders placed or performed during the hospital encounter of 05/20/17 (from the past 24 hour(s))  Urinalysis, Routine w reflex microscopic     Status: Abnormal   Collection Time: 05/20/17  3:49 PM  Result Value Ref Range   Color, Urine STRAW (A) YELLOW   APPearance CLOUDY (A) CLEAR   Specific Gravity, Urine 1.023 1.005 - 1.030   pH 5.0 5.0 - 8.0   Glucose, UA NEGATIVE NEGATIVE mg/dL   Hgb urine dipstick LARGE (A) NEGATIVE   Bilirubin Urine NEGATIVE NEGATIVE   Ketones, ur NEGATIVE NEGATIVE mg/dL   Protein, ur 100 (A) NEGATIVE mg/dL   Nitrite NEGATIVE NEGATIVE   Leukocytes, UA NEGATIVE NEGATIVE   RBC / HPF TOO  NUMEROUS TO COUNT 0 - 5 RBC/hpf   WBC, UA NONE SEEN 0 - 5 WBC/hpf   Bacteria, UA RARE (A) NONE SEEN   Squamous Epithelial / LPF 0-5 (A) NONE SEEN   Mucus PRESENT   Pregnancy, urine POC     Status: None   Collection Time: 05/20/17  3:52 PM  Result Value Ref Range   Preg Test, Ur NEGATIVE NEGATIVE  hCG, serum, qualitative     Status: None   Collection Time: 05/20/17  4:06 PM  Result Value Ref Range   Preg, Serum NEGATIVE NEGATIVE  CBC     Status: Abnormal   Collection Time: 05/20/17  4:06 PM  Result Value Ref Range   WBC 5.5 4.0 - 10.5 K/uL   RBC 3.77 (L) 3.87 - 5.11 MIL/uL   Hemoglobin 6.9  (LL) 12.0 - 15.0 g/dL   HCT 24.3 (L) 36.0 - 46.0 %   MCV 64.5 (L) 78.0 - 100.0 fL   MCH 18.3 (L) 26.0 - 34.0 pg   MCHC 28.4 (L) 30.0 - 36.0 g/dL   RDW 21.6 (H) 11.5 - 15.5 %   Platelets 820 (H) 150 - 400 K/uL    Assessment and Plan  Abnormal uterine bleeding (AUB) - Plan: US PELVIS TRANSVANGINAL NON-OB (TV ONLY) - Advised someone from the [facility] dept will call her to schedule OP TVUS - F/U with MD at [facility] after TVUS to discuss results and treatment plan - Advised to take Aleve (Naprosyn) 500 mg by mouth at onset of bleeding and 3-5 hours later, then 250 to 500 mg twice daily.  This should be started the first day of bleeding and continued for 4-5 days or until bleeding stops.  Anemia - TC to patient @ 1810 notifying her of anemia and need to Rx iron supplements -- patient states she "has iron supplements at home" - Advised to take iron supplements twice daily  Discharge home  Patient verbalized an understanding of the plan of care and agrees.   Laury Deep, MSN, CNM 05/20/2017, 5:30 PM

## 2017-05-23 ENCOUNTER — Other Ambulatory Visit (HOSPITAL_COMMUNITY): Payer: Self-pay | Admitting: Obstetrics and Gynecology

## 2017-05-26 ENCOUNTER — Other Ambulatory Visit: Payer: Self-pay | Admitting: Obstetrics and Gynecology

## 2017-05-26 DIAGNOSIS — N939 Abnormal uterine and vaginal bleeding, unspecified: Secondary | ICD-10-CM

## 2017-05-30 ENCOUNTER — Ambulatory Visit (HOSPITAL_COMMUNITY): Admission: RE | Admit: 2017-05-30 | Payer: Self-pay | Source: Ambulatory Visit

## 2017-10-30 ENCOUNTER — Encounter (HOSPITAL_COMMUNITY): Payer: Self-pay

## 2017-10-30 ENCOUNTER — Emergency Department (HOSPITAL_COMMUNITY)
Admission: EM | Admit: 2017-10-30 | Discharge: 2017-10-30 | Disposition: A | Discharge status: Home / Self Care | Payer: Self-pay | Attending: Emergency Medicine | Admitting: Emergency Medicine

## 2017-10-30 ENCOUNTER — Emergency Department (HOSPITAL_COMMUNITY): Payer: Self-pay

## 2017-10-30 DIAGNOSIS — M25562 Pain in left knee: Secondary | ICD-10-CM | POA: Insufficient documentation

## 2017-10-30 DIAGNOSIS — J45909 Unspecified asthma, uncomplicated: Secondary | ICD-10-CM | POA: Insufficient documentation

## 2017-10-30 DIAGNOSIS — Y939 Activity, unspecified: Secondary | ICD-10-CM | POA: Insufficient documentation

## 2017-10-30 DIAGNOSIS — Z87891 Personal history of nicotine dependence: Secondary | ICD-10-CM | POA: Insufficient documentation

## 2017-10-30 DIAGNOSIS — Z79899 Other long term (current) drug therapy: Secondary | ICD-10-CM | POA: Insufficient documentation

## 2017-10-30 DIAGNOSIS — Y999 Unspecified external cause status: Secondary | ICD-10-CM | POA: Insufficient documentation

## 2017-10-30 DIAGNOSIS — W2209XA Striking against other stationary object, initial encounter: Secondary | ICD-10-CM | POA: Insufficient documentation

## 2017-10-30 DIAGNOSIS — Y929 Unspecified place or not applicable: Secondary | ICD-10-CM | POA: Insufficient documentation

## 2017-10-30 MED ORDER — NAPROXEN 250 MG PO TABS
500.0000 mg | ORAL_TABLET | Freq: Once | ORAL | Status: AC
  Administered 2017-10-30: 500 mg via ORAL
  Filled 2017-10-30: qty 2

## 2017-10-30 NOTE — ED Provider Notes (Signed)
[facility] EMERGENCY DEPARTMENT Provider Note   CSN: [id number] Arrival date & time: 10/30/17  1756     History   Chief Complaint Chief Complaint  Patient presents with  . Knee Pain    HPI Brandy Howard is a 22 y.o. female presents with left knee pain. She states she injured her knee about 4 years ago when she slipped on ice. She was told she sprained it. She uses a knee brace sometimes. Over the years she has had intermittent knee pain. It sometimes gives out on her and clicks/pops. A couple days ago she hit it on a metal pole which caused worsening of her pain. She has been taking Tylenol and Ibuprofen without significant relief. She has difficulty walking. She has not seen orthopedics for this issue.  HPI  Past Medical History:  Diagnosis Date  . Anemia   . Anxiety   . Asthma   . Depression    doing ok  . Eczema   . Genital herpes   . Seasonal allergies     Patient Active Problem List   Diagnosis Date Noted  . Abnormal uterine bleeding (AUB) 05/20/2017  . Herpes infection 07/03/2014  . Anemia 06/30/2014  . Symptomatic anemia 06/30/2014    Past Surgical History:  Procedure Laterality Date  . NO PAST SURGERIES      OB History    Gravida Para Term Preterm AB Living   0 0 0 0 0 0   SAB TAB Ectopic Multiple Live Births   0 0 0 0         Home Medications    Prior to Admission medications   Medication Sig Start Date End Date Taking? Authorizing Provider  lidocaine (XYLOCAINE) 2 % jelly Apply 1 application topically 3 (three) times daily as needed. Patient taking differently: Apply 1 application topically 3 (three) times daily as needed (outbreak).  10/13/16   Julianne Handler, CNM  PRESCRIPTION MEDICATION Take 1 tablet by mouth daily. turmeric & ginger    [provider]    Family History Family History  Adopted: Yes  Family history unknown: Yes    Social History Social History   Tobacco Use  . Smoking status: Former  Smoker    Types: Cigars  . Smokeless tobacco: Never Used  . Tobacco comment: April 2017  Substance Use Topics  . Alcohol use: Yes    Comment: Social   . Drug use: No     Allergies   Dust mite extract   Review of Systems Review of Systems  Musculoskeletal: Positive for arthralgias. Negative for joint swelling.  Skin: Negative for wound.  Neurological: Negative for weakness and numbness.     Physical Exam Updated Vital Signs BP 115/72   Pulse 92   Temp 98.7 F (37.1 C) (Oral)   Resp 18   SpO2 100%   Physical Exam  Constitutional: She is oriented to person, place, and time. She appears well-developed and well-nourished. No distress.  HENT:  Head: Normocephalic and atraumatic.  Eyes: Conjunctivae are normal. Pupils are equal, round, and reactive to light. Right eye exhibits no discharge. Left eye exhibits no discharge. No scleral icterus.  Neck: Normal range of motion.  Cardiovascular: Normal rate.  Pulmonary/Chest: Effort normal. No respiratory distress.  Abdominal: She exhibits no distension.  Musculoskeletal:  Difficult exam due to the patient's pain. No obvious swelling, deformity, or warmth. Diffuse tenderness. FROM. Negative AP drawer. Negative valgus and varus stress.   Neurological: She is alert  and oriented to person, place, and time.  Skin: Skin is warm and dry.  Psychiatric: She has a normal mood and affect. Her behavior is normal.  Nursing note and vitals reviewed.    ED Treatments / Results  Labs (all labs ordered are listed, but only abnormal results are displayed) Labs Reviewed - No data to display  EKG  EKG Interpretation None       Radiology Dg Knee Complete 4 Views Left  Result Date: 10/30/2017 CLINICAL DATA:  Injury EXAM: LEFT KNEE - COMPLETE 4+ VIEW COMPARISON:  08/28/2012 FINDINGS: No evidence of fracture, dislocation, or joint effusion. No evidence of arthropathy or other focal bone abnormality. Soft tissues are unremarkable.  IMPRESSION: Negative. Electronically Signed   By: Donavan Foil M.D.   On: 10/30/2017 19:06    Procedures Procedures (including critical care time)  Medications Ordered in ED Medications  naproxen (NAPROSYN) tablet 500 mg (not administered)     Initial Impression / Assessment and Plan / ED Course  I have reviewed the triage vital signs and the nursing notes.  Pertinent labs & imaging results that were available during my care of the patient were reviewed by me and considered in my medical decision making (see chart for details).  22 year old female presents with acutely worsening knee pain. Vitals are normal. Xray is negative. Suspect ligament injury vs meniscus injury. Will place in knee immobilizer and provide crutches. RICE protocol discussed. Ortho referral was given.  Final Clinical Impressions(s) / ED Diagnoses   Final diagnoses:  Acute pain of left knee    ED Discharge Orders    None       Brandy Evangelist, PA-C 10/30/17 1932    Brandy Evangelist, PA-C 10/30/17 1933    Brandy Leigh, MD 10/30/17 2350

## 2017-10-30 NOTE — Discharge Instructions (Signed)
Please continue Tylenol and Ibuprofen for pain Please rest, and continue to ice and elevate the knee Wear knee immobilizer and use crutches until you are not having severe pain with walking Follow up with Orthopedics for further work up

## 2017-10-30 NOTE — ED Triage Notes (Signed)
Patient complains of left knee pain x 2 day after hitting omn metal object. Alert and oriented, NAD

## 2018-02-08 ENCOUNTER — Inpatient Hospital Stay (HOSPITAL_COMMUNITY)
Admission: AD | Admit: 2018-02-08 | Discharge: 2018-02-08 | Disposition: A | Discharge status: Home / Self Care | Payer: Self-pay | Source: Ambulatory Visit | Attending: Obstetrics and Gynecology | Admitting: Obstetrics and Gynecology

## 2018-02-08 ENCOUNTER — Other Ambulatory Visit: Payer: Self-pay | Admitting: Obstetrics and Gynecology

## 2018-02-08 ENCOUNTER — Encounter (HOSPITAL_COMMUNITY): Payer: Self-pay

## 2018-02-08 ENCOUNTER — Other Ambulatory Visit: Payer: Self-pay

## 2018-02-08 DIAGNOSIS — Z87891 Personal history of nicotine dependence: Secondary | ICD-10-CM | POA: Insufficient documentation

## 2018-02-08 DIAGNOSIS — D473 Essential (hemorrhagic) thrombocythemia: Secondary | ICD-10-CM | POA: Insufficient documentation

## 2018-02-08 DIAGNOSIS — J45909 Unspecified asthma, uncomplicated: Secondary | ICD-10-CM | POA: Insufficient documentation

## 2018-02-08 DIAGNOSIS — N939 Abnormal uterine and vaginal bleeding, unspecified: Secondary | ICD-10-CM

## 2018-02-08 DIAGNOSIS — R109 Unspecified abdominal pain: Secondary | ICD-10-CM | POA: Insufficient documentation

## 2018-02-08 DIAGNOSIS — F329 Major depressive disorder, single episode, unspecified: Secondary | ICD-10-CM | POA: Insufficient documentation

## 2018-02-08 DIAGNOSIS — F419 Anxiety disorder, unspecified: Secondary | ICD-10-CM | POA: Insufficient documentation

## 2018-02-08 DIAGNOSIS — Z79899 Other long term (current) drug therapy: Secondary | ICD-10-CM | POA: Insufficient documentation

## 2018-02-08 DIAGNOSIS — D75839 Thrombocytosis, unspecified: Secondary | ICD-10-CM

## 2018-02-08 DIAGNOSIS — D649 Anemia, unspecified: Secondary | ICD-10-CM

## 2018-02-08 LAB — URINALYSIS, ROUTINE W REFLEX MICROSCOPIC
BILIRUBIN URINE: NEGATIVE
Glucose, UA: NEGATIVE mg/dL
Ketones, ur: NEGATIVE mg/dL
Nitrite: NEGATIVE
PROTEIN: 30 mg/dL — AB
Specific Gravity, Urine: 1.012 (ref 1.005–1.030)
pH: 8 (ref 5.0–8.0)

## 2018-02-08 LAB — CBC
HCT: 29.5 % — ABNORMAL LOW (ref 36.0–46.0)
Hemoglobin: 8.3 g/dL — ABNORMAL LOW (ref 12.0–15.0)
MCH: 19.4 pg — ABNORMAL LOW (ref 26.0–34.0)
MCHC: 28.1 g/dL — ABNORMAL LOW (ref 30.0–36.0)
MCV: 69.1 fL — AB (ref 78.0–100.0)
PLATELETS: 904 10*3/uL — AB (ref 150–400)
RBC: 4.27 MIL/uL (ref 3.87–5.11)
RDW: 19.1 % — ABNORMAL HIGH (ref 11.5–15.5)
WBC: 5.5 10*3/uL (ref 4.0–10.5)

## 2018-02-08 LAB — POCT PREGNANCY, URINE: Preg Test, Ur: NEGATIVE

## 2018-02-08 MED ORDER — KETOROLAC TROMETHAMINE 60 MG/2ML IM SOLN
60.0000 mg | Freq: Once | INTRAMUSCULAR | Status: AC
  Administered 2018-02-08: 60 mg via INTRAMUSCULAR
  Filled 2018-02-08: qty 2

## 2018-02-08 MED ORDER — NORGESTIMATE-ETH ESTRADIOL 0.25-35 MG-MCG PO TABS: 1.0000 | ORAL_TABLET | Freq: Every day | ORAL | 11 refills | Status: DC

## 2018-02-08 MED ORDER — NORELGESTROMIN-ETH ESTRADIOL 150-35 MCG/24HR TD PTWK: 1.0000 | MEDICATED_PATCH | TRANSDERMAL | 11 refills | Status: DC

## 2018-02-08 MED ORDER — MEDROXYPROGESTERONE ACETATE 10 MG PO TABS: 10.0000 mg | ORAL_TABLET | Freq: Every day | ORAL | 0 refills | Status: DC

## 2018-02-08 MED ORDER — IBUPROFEN 600 MG PO TABS: 600.0000 mg | ORAL_TABLET | Freq: Four times a day (QID) | ORAL | 0 refills | Status: DC | PRN

## 2018-02-08 NOTE — Discharge Instructions (Signed)
Abnormal Uterine Bleeding Abnormal uterine bleeding can affect women at various stages in life, including teenagers, women in their reproductive years, pregnant women, and women who have reached menopause. Several kinds of uterine bleeding are considered abnormal, including:  Bleeding or spotting between periods.  Bleeding after sexual intercourse.  Bleeding that is heavier or more than normal.  Periods that last longer than usual.  Bleeding after menopause. Many cases of abnormal uterine bleeding are minor and simple to treat, while others are more serious. Any type of abnormal bleeding should be evaluated by your health care provider. Treatment will depend on the cause of the bleeding. Follow these instructions at home: Monitor your condition for any changes. The following actions may help to alleviate any discomfort you are experiencing:  Avoid the use of tampons and douches as directed by your health care provider.  Change your pads frequently. You should get regular pelvic exams and Pap tests. Keep all follow-up appointments for diagnostic tests as directed by your health care provider. Contact a health care provider if:  Your bleeding lasts more than 1 week.  You feel dizzy at times. Get help right away if:  You pass out.  You are changing pads every 15 to 30 minutes.  You have abdominal pain.  You have a fever.  You become sweaty or weak.  You are passing large blood clots from the vagina.  You start to feel nauseous and vomit. This information is not intended to replace advice given to you by your health care provider. Make sure you discuss any questions you have with your health care provider. Document Released: 08/30/2005 Document Revised: 02/11/2016 Document Reviewed: 03/29/2013 Elsevier Interactive Patient Education  2017 Elsevier Inc.  

## 2018-02-08 NOTE — Progress Notes (Signed)
CRITICAL VALUE ALERT  Critical Value:  Platelet 904  Date & Time Notied:  02/08/18 1430  Provider Notified: Altamease Oiler NP  Orders Received/Actions taken: Provider notified by RN

## 2018-02-08 NOTE — MAU Provider Note (Signed)
History     CSN: [id number]  Arrival date and time: 02/08/18 1300   First Provider Initiated Contact with Patient 02/08/18 1416      Chief Complaint  Patient presents with  . Abdominal Pain  . Vaginal Bleeding  . Nausea   HPI   [name] is a 22 y.o. non pregnant female with a history of thrombocytosis [id number] here in MAU with abnormal vaginal bleeding. Says for the past 3 months she has had very abnormal menstrual cycles. Says this cycle started on Monday. Since then she has had abdominal pain that is more severe than pain she has with her normal cycles. Says her bleeding is heavy with clots. She has tried different teas, heating pads, and ibuprofen which is not helping her pain/crampng. She is not taking the ibuprofen consistently. She is not on any type of hormones at this time. She has been to MAU in the past however has not follow up with the plan due to cost. She does not have GYN care and is due for a PAP, she does not have a PCP.   OB History    Gravida  0   Para  0   Term  0   Preterm  0   AB  0   Living  0     SAB  0   TAB  0   Ectopic  0   Multiple  0   Live Births              Past Medical History:  Diagnosis Date  . Anemia   . Anxiety   . Asthma   . Depression    doing ok  . Eczema   . Genital herpes   . Seasonal allergies     Past Surgical History:  Procedure Laterality Date  . NO PAST SURGERIES      Family History  Adopted: Yes  Family history unknown: Yes    Social History   Tobacco Use  . Smoking status: Former Smoker    Types: Cigars  . Smokeless tobacco: Never Used  . Tobacco comment: April 2017  Substance Use Topics  . Alcohol use: Yes    Comment: Social   . Drug use: No    Allergies:  Allergies  Allergen Reactions  . Dust Mite Extract Itching  . Latex Swelling    Medications Prior to Admission  Medication Sig Dispense Refill Last Dose  . acetaminophen (TYLENOL) 500 MG tablet Take 1,000 mg by  mouth every 8 (eight) hours as needed for mild pain.   Past Month at Unknown time  . diphenhydrAMINE (BENADRYL) 25 MG tablet Take 50 mg by mouth at bedtime as needed for allergies or sleep.   Past Month at Unknown time  . ibuprofen (ADVIL,MOTRIN) 200 MG tablet Take 400 mg by mouth every 8 (eight) hours as needed for mild pain.   Past Month at Unknown time  . lidocaine (XYLOCAINE) 2 % jelly Apply 1 application topically 3 (three) times daily as needed. (Patient not taking: Reported on 02/08/2018) 30 mL 1 Not Taking at Unknown time  . PRESCRIPTION MEDICATION Take 1 tablet by mouth daily. turmeric & ginger   Past Week at Unknown time   Results for orders placed or performed during the hospital encounter of 02/08/18 (from the past 48 hour(s))  Urinalysis, Routine w reflex microscopic     Status: Abnormal   Collection Time: 02/08/18  1:10 PM  Result Value Ref Range   Color,  Urine YELLOW YELLOW   APPearance HAZY (A) CLEAR   Specific Gravity, Urine 1.012 1.005 - 1.030   pH 8.0 5.0 - 8.0   Glucose, UA NEGATIVE NEGATIVE mg/dL   Hgb urine dipstick LARGE (A) NEGATIVE   Bilirubin Urine NEGATIVE NEGATIVE   Ketones, ur NEGATIVE NEGATIVE mg/dL   Protein, ur 30 (A) NEGATIVE mg/dL   Nitrite NEGATIVE NEGATIVE   Leukocytes, UA SMALL (A) NEGATIVE   RBC / HPF >50 (H) 0 - 5 RBC/hpf   WBC, UA >50 (H) 0 - 5 WBC/hpf   Bacteria, UA RARE (A) NONE SEEN   Squamous Epithelial / LPF 6-10 0 - 5    Comment: Performed at [facility], [address] [place], [place] [address]  CBC     Status: Abnormal   Collection Time: 02/08/18  1:34 PM  Result Value Ref Range   WBC 5.5 4.0 - 10.5 K/uL   RBC 4.27 3.87 - 5.11 MIL/uL   Hemoglobin 8.3 (L) 12.0 - 15.0 g/dL   HCT 29.5 (L) 36.0 - 46.0 %   MCV 69.1 (L) 78.0 - 100.0 fL   MCH 19.4 (L) 26.0 - 34.0 pg   MCHC 28.1 (L) 30.0 - 36.0 g/dL   RDW 19.1 (H) 11.5 - 15.5 %   Platelets 904 (HH) 150 - 400 K/uL    Comment: REPEATED TO VERIFY PLATELET COUNT CONFIRMED BY  SMEAR CRITICAL RESULT CALLED TO, READ BACK BY AND VERIFIED WITH: SCHMIDT R AT 1431 ON [id number] BY FORSYTK Performed at [facility], [address] [place], [place] [address]   Pregnancy, urine POC     Status: None   Collection Time: 02/08/18  1:39 PM  Result Value Ref Range   Preg Test, Ur NEGATIVE NEGATIVE    Comment:        THE SENSITIVITY OF THIS METHODOLOGY IS >24 mIU/mL     Review of Systems  Constitutional: Negative for fever.  Gastrointestinal: Positive for abdominal pain.  Genitourinary: Positive for vaginal bleeding.  Neurological: Negative for dizziness (Not all the time, sometimes. ).   Physical Exam   Blood pressure 108/71, pulse 94, temperature 98.1 F (36.7 C), temperature source Oral, resp. rate 16, height 5\' 4"  (1.626 m), weight 114 lb 12 oz (52.1 kg), last menstrual period 02/06/2018, SpO2 98 %.  Physical Exam  Constitutional: She is oriented to person, place, and time. She appears well-developed and well-nourished.  Non-toxic appearance. She does not have a sickly appearance. She does not appear ill. No distress.  Cardiovascular: Normal rate.  Respiratory: Effort normal and breath sounds normal. No respiratory distress. She has no wheezes. She has no rales. She exhibits no tenderness.  Neurological: She is alert and oriented to person, place, and time.  Skin: Skin is warm. She is not diaphoretic.  Psychiatric: Her behavior is normal.  Blood pressure 108/71, pulse 94, temperature 98.1 F (36.7 C), temperature source Oral, resp. rate 16, height 5\' 4"  (1.626 m), weight 114 lb 12 oz (52.1 kg), last menstrual period 02/06/2018, SpO2 98 %.   MAU Course  Procedures  None  MDM  UA CBC  Thrombocytosis likely chronic, elevated back in 2018 likely 2/2 chronic abnormal vaginal bleeding. Discussed the importance of taken Iron pills BID with orange juice. Discussed with Dr. Elonda Husky, ok to have patient take progesterone for bleeding management. Patient agreeable.  Patient is stable for DC home.   Assessment and Plan   A:  1. Abnormal vaginal bleeding   2. Anemia, unspecified type  3. Thrombocytosis ([place])     P:  Discharge home with strict return precautions Bleeding precautions Rx: Provera #15, Ibuprofen 600 mg Q6 Return to MAU if become symptomatic  Referral to [facility] for [facility]AP and GYN care. Patient needs long term plan for abnormal vaginal bleeding management. Discussed importance of PCP  OTC iron BID   Rasch, Artist Pais, NP 02/08/2018 7:39 PM

## 2018-02-08 NOTE — MAU Note (Signed)
Having severe abd pain with her period.   Passing clots the size of quarters.  Has had this problem before. Last 3 months, has been having severe pre ? Symptoms, breasts go up a full size.  Certain foods nauseate her, the smells.  Has also been SOB and dizzy at times. Cycles are irregular

## 2018-02-10 ENCOUNTER — Emergency Department (HOSPITAL_COMMUNITY)
Admission: EM | Admit: 2018-02-10 | Discharge: 2018-02-10 | Disposition: A | Discharge status: Home / Self Care | Payer: No Typology Code available for payment source | Attending: Emergency Medicine | Admitting: Emergency Medicine

## 2018-02-10 ENCOUNTER — Emergency Department (HOSPITAL_COMMUNITY): Payer: No Typology Code available for payment source

## 2018-02-10 ENCOUNTER — Encounter (HOSPITAL_COMMUNITY): Payer: Self-pay

## 2018-02-10 DIAGNOSIS — M791 Myalgia, unspecified site: Secondary | ICD-10-CM | POA: Insufficient documentation

## 2018-02-10 DIAGNOSIS — Z041 Encounter for examination and observation following transport accident: Secondary | ICD-10-CM | POA: Diagnosis not present

## 2018-02-10 DIAGNOSIS — R079 Chest pain, unspecified: Secondary | ICD-10-CM | POA: Diagnosis not present

## 2018-02-10 DIAGNOSIS — J45909 Unspecified asthma, uncomplicated: Secondary | ICD-10-CM | POA: Diagnosis not present

## 2018-02-10 DIAGNOSIS — R51 Headache: Secondary | ICD-10-CM | POA: Diagnosis present

## 2018-02-10 DIAGNOSIS — M542 Cervicalgia: Secondary | ICD-10-CM | POA: Insufficient documentation

## 2018-02-10 DIAGNOSIS — M7918 Myalgia, other site: Secondary | ICD-10-CM

## 2018-02-10 LAB — PATHOLOGIST SMEAR REVIEW

## 2018-02-10 LAB — POC URINE PREG, ED: Preg Test, Ur: NEGATIVE

## 2018-02-10 MED ORDER — METHOCARBAMOL 500 MG PO TABS: 500.0000 mg | ORAL_TABLET | Freq: Three times a day (TID) | ORAL | 0 refills | Status: AC
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
## 2018-02-16 ENCOUNTER — Ambulatory Visit: Payer: Self-pay | Admitting: Pediatrics

## 2018-02-24 ENCOUNTER — Ambulatory Visit (INDEPENDENT_AMBULATORY_CARE_PROVIDER_SITE_OTHER): Payer: Medicaid Other | Admitting: Family

## 2018-02-24 ENCOUNTER — Encounter: Payer: Self-pay | Admitting: Family

## 2018-02-24 ENCOUNTER — Telehealth: Payer: Self-pay

## 2018-02-24 VITALS — BP 116/71 | HR 87 | Ht 63.0 in | Wt 116.8 lb

## 2018-02-24 DIAGNOSIS — Z3202 Encounter for pregnancy test, result negative: Secondary | ICD-10-CM

## 2018-02-24 DIAGNOSIS — D509 Iron deficiency anemia, unspecified: Secondary | ICD-10-CM

## 2018-02-24 DIAGNOSIS — N920 Excessive and frequent menstruation with regular cycle: Secondary | ICD-10-CM | POA: Diagnosis not present

## 2018-02-24 DIAGNOSIS — Z113 Encounter for screening for infections with a predominantly sexual mode of transmission: Secondary | ICD-10-CM | POA: Diagnosis not present

## 2018-02-24 LAB — CBC
HEMATOCRIT: 31 % — AB (ref 35.0–45.0)
HEMOGLOBIN: 9.4 g/dL — AB (ref 11.7–15.5)
MCH: 21.6 pg — ABNORMAL LOW (ref 27.0–33.0)
MCHC: 30.3 g/dL — ABNORMAL LOW (ref 32.0–36.0)
MCV: 71.3 fL — AB (ref 80.0–100.0)
MPV: 10.3 fL (ref 7.5–12.5)
Platelets: 103 10*3/uL — ABNORMAL LOW (ref 140–400)
RBC: 4.35 10*6/uL (ref 3.80–5.10)
RDW: 23.1 % — ABNORMAL HIGH (ref 11.0–15.0)
WBC: 5.3 10*3/uL (ref 3.8–10.8)

## 2018-02-24 LAB — POCT URINE PREGNANCY: Preg Test, Ur: NEGATIVE

## 2018-02-24 MED ORDER — MEDROXYPROGESTERONE ACETATE 10 MG PO TABS: 10.0000 mg | ORAL_TABLET | Freq: Every day | ORAL | 0 refills | Status: DC

## 2018-02-24 MED ORDER — CYCLOBENZAPRINE HCL 10 MG PO TABS: ORAL_TABLET | ORAL | 0 refills | Status: DC

## 2018-02-24 MED ORDER — FERROUS SULFATE 325 (65 FE) MG PO TBEC: 325.0000 mg | DELAYED_RELEASE_TABLET | Freq: Three times a day (TID) | ORAL | 0 refills | Status: DC

## 2018-02-24 NOTE — Patient Instructions (Signed)
Continue taking Provera daily until we see you again.  We will review the results of your labs today when you return on Tuesday.  Take the Flexeril 3-4 hours before your scheduled appointment.

## 2018-02-24 NOTE — Telephone Encounter (Signed)
REYA FROM THE [facility] CALLED TO ADVISE THAT THEY LOGGED THE SPECIMEN FOR THE  PLATELET FUNCTION BUT FAILED TO SEND THE SPECIMEN TO [place] FOR PROCESSING. THEREFORE THE SPECIMEN MUST BE RECOLLECTED.   THE PATIENT HAS BEEN CALLED AND ADVISED WHERE THE MAIN LAB IS IN [place].
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
## 2018-02-27 LAB — HIV ANTIBODY (ROUTINE TESTING W REFLEX): HIV 1&2 Ab, 4th Generation: NONREACTIVE

## 2018-02-27 LAB — RPR: RPR Ser Ql: NONREACTIVE

## 2018-02-27 NOTE — Telephone Encounter (Signed)
Will have patient recollect when she returns for IUD placement if she hasn't by the time she returns.

## 2018-02-28 ENCOUNTER — Ambulatory Visit: Payer: Medicaid Other

## 2018-02-28 LAB — C. TRACHOMATIS/N. GONORRHOEAE RNA
C. TRACHOMATIS RNA, TMA: NOT DETECTED
N. GONORRHOEAE RNA, TMA: NOT DETECTED

## 2018-03-01 LAB — TESTOS,TOTAL,FREE AND SHBG (FEMALE)
FREE TESTOSTERONE: 2.6 pg/mL (ref 0.1–6.4)
SEX HORMONE BINDING: 63 nmol/L (ref 17–124)
Testosterone, Total, LC-MS-MS: 32 ng/dL (ref 2–45)

## 2018-03-02 ENCOUNTER — Other Ambulatory Visit: Payer: Self-pay | Admitting: Family

## 2018-03-02 LAB — VON WILLEBRAND COMPREHENSIVE PANEL
APTT: 24 s (ref 22–34)
FACTOR-VIII ACTIVITY: 273 %{normal} — AB (ref 50–180)
RISTOCETIN CO-FACTOR: 182 %{normal} (ref 42–200)
Von Willebrand Antigen, Plasma: 258 % — ABNORMAL HIGH (ref 50–217)

## 2018-03-02 LAB — PROLACTIN: PROLACTIN: 14.3 ng/mL

## 2018-03-02 LAB — DHEA-SULFATE: DHEA-SO4: 136 ug/dL (ref 18–391)

## 2018-03-02 LAB — PROTIME-INR
INR: 1
PROTHROMBIN TIME: 11.1 s (ref 9.0–11.5)

## 2018-03-02 LAB — TSH: TSH: 1.16 mIU/L

## 2018-03-02 LAB — FOLLICLE STIMULATING HORMONE: FSH: 1.4 m[IU]/mL — ABNORMAL LOW

## 2018-03-02 LAB — 17-HYDROXYPROGESTERONE: 17-OH-Progesterone, LC/MS/MS: 36 ng/dL

## 2018-03-02 LAB — LUTEINIZING HORMONE: LH: 3.6 m[IU]/mL

## 2018-03-07 ENCOUNTER — Telehealth: Payer: Self-pay

## 2018-03-07 ENCOUNTER — Other Ambulatory Visit: Payer: Self-pay | Admitting: Family

## 2018-03-07 NOTE — Telephone Encounter (Signed)
Pt called stating she was unable to fill medication to stop her period. She has now started her period and wanted to know if she can still have IUD placed on her menses. Called number on file, no answer, no VM option avail. If patient calls back-please let her know that patient can still receive IUD and to keep appointment.

## 2018-03-07 NOTE — Telephone Encounter (Signed)
She was on this to bridge to IUD placement for bleeding- she is meant to get IUD placed today.

## 2018-03-07 NOTE — Telephone Encounter (Signed)
Pharmacy left message to clarify- medroxyPROGESTERone (PROVERA) 10 MG tablet for 6 doses? Routing to [place] to review.

## 2018-03-08 ENCOUNTER — Ambulatory Visit: Payer: Medicaid Other | Admitting: Family

## 2018-03-08 ENCOUNTER — Encounter: Payer: Self-pay | Admitting: Family

## 2018-03-08 VITALS — Ht 62.8 in | Wt 117.0 lb

## 2018-03-08 DIAGNOSIS — Z538 Procedure and treatment not carried out for other reasons: Secondary | ICD-10-CM

## 2018-03-08 MED ORDER — MEDROXYPROGESTERONE ACETATE 10 MG PO TABS: 10.0000 mg | ORAL_TABLET | Freq: Every day | ORAL | 0 refills | Status: DC

## 2018-03-08 NOTE — Progress Notes (Signed)
History was provided by the patient.  Brandy Howard is a 22 y.o. female who is here for IUD insertion.   PCP confirmed? Yes.    Nolene Ebbs, MD  HPI:   -22 yo female with AUB, anemia due to AUB, desires IUD.  -She took Flexeril before this appointment.  -Has been taking Provera to stop bleeding    Lab Results  Component Value Date   HGB 9.4 (L) 02/24/2018   Review of Systems  Constitutional: Positive for malaise/fatigue.  Eyes: Negative for double vision.  Respiratory: Negative for shortness of breath.   Cardiovascular: Negative for chest pain and palpitations.  Gastrointestinal: Negative for abdominal pain, constipation, diarrhea, nausea and vomiting.  Genitourinary: Negative for dysuria.  Musculoskeletal: Negative for joint pain and myalgias.  Skin: Negative for rash.  Neurological: Negative for dizziness and headaches.  Endo/Heme/Allergies: Does not bruise/bleed easily.     Patient Active Problem List   Diagnosis Date Noted  . Abnormal uterine bleeding (AUB) 05/20/2017  . Herpes infection 07/03/2014  . Anemia 06/30/2014  . Symptomatic anemia 06/30/2014    Current Outpatient Medications on File Prior to Visit  Medication Sig Dispense Refill  . cyclobenzaprine (FLEXERIL) 10 MG tablet Take 1 tablet (10 mg total) by mouth 3-4 hours prior to your scheduled appointment. 30 tablet 0  . ferrous sulfate (FE TABS) 325 (65 FE) MG EC tablet Take 1 tablet (325 mg total) by mouth 3 (three) times daily with meals. 90 tablet 0  . ibuprofen (ADVIL,MOTRIN) 600 MG tablet Take 1 tablet (600 mg total) by mouth every 6 (six) hours as needed. 30 tablet 0  . lidocaine (XYLOCAINE) 2 % jelly Apply 1 application topically 3 (three) times daily as needed. (Patient not taking: Reported on 02/08/2018) 30 mL 1  . medroxyPROGESTERone (PROVERA) 10 MG tablet TAKE 1 TABLET (10 MG TOTAL) BY MOUTH DAILY FOR 6 DOSES. (Patient not taking: Reported on 03/08/2018) 30 tablet 0  . medroxyPROGESTERone  (PROVERA) 10 MG tablet TAKE 1 TABLET (10 MG TOTAL) BY MOUTH DAILY FOR 6 DOSES. (Patient not taking: Reported on 03/08/2018) 6 tablet 0  . norgestimate-ethinyl estradiol (ORTHO-CYCLEN,SPRINTEC,PREVIFEM) 0.25-35 MG-MCG tablet Take 1 tablet by mouth daily. (Patient not taking: Reported on 03/08/2018) 1 Package 11  . PRESCRIPTION MEDICATION Take 1 tablet by mouth daily. turmeric & ginger     No current facility-administered medications on file prior to visit.     Allergies  Allergen Reactions  . Dust Mite Extract Itching  . Latex Swelling    Physical Exam:    Vitals:   03/08/18 1658  Weight: 117 lb (53.1 kg)  Height: 5' 2.8" (1.595 m)    Growth percentile SmartLinks can only be used for patients less than 55 years old. Patient's last menstrual period was 02/06/2018.  Physical Exam  Constitutional: She is oriented to person, place, and time. She appears well-developed. No distress.  HENT:  Head: Normocephalic and atraumatic.  Eyes: Pupils are equal, round, and reactive to light. EOM are normal. No scleral icterus.  Neck: Normal range of motion. Neck supple. No thyromegaly present.  Cardiovascular: Normal rate and regular rhythm.  No murmur heard. Pulmonary/Chest: Effort normal.  Abdominal: Soft.  Musculoskeletal: Normal range of motion. She exhibits no edema.  Lymphadenopathy:    She has no cervical adenopathy.  Neurological: She is alert and oriented to person, place, and time. No cranial nerve deficit.  Skin: Skin is warm and dry. No rash noted.  Psychiatric: She has a normal mood and  affect.    Assessment/Plan: 1. Unsuccessful attempt to insert intrauterine device (IUD) -return again for another attempt -take Flexeril 4 hours prior to appointment time

## 2018-03-08 NOTE — Patient Instructions (Signed)
Take Flexeril 10 mg prior to your appointment.  We will attempt the IUD insertion again.  Advise if questions before then.

## 2018-03-10 ENCOUNTER — Ambulatory Visit: Payer: Medicaid Other | Admitting: Family

## 2018-03-15 ENCOUNTER — Ambulatory Visit (INDEPENDENT_AMBULATORY_CARE_PROVIDER_SITE_OTHER): Payer: Medicaid Other | Admitting: Family

## 2018-03-15 ENCOUNTER — Encounter: Payer: Self-pay | Admitting: Family

## 2018-03-15 VITALS — BP 116/77 | HR 97 | Ht 62.6 in | Wt 120.6 lb

## 2018-03-15 DIAGNOSIS — Z30430 Encounter for insertion of intrauterine contraceptive device: Secondary | ICD-10-CM

## 2018-03-15 DIAGNOSIS — Z113 Encounter for screening for infections with a predominantly sexual mode of transmission: Secondary | ICD-10-CM

## 2018-03-15 MED ORDER — LEVONORGESTREL 20 MCG/24HR IU IUD
INTRAUTERINE_SYSTEM | Freq: Once | INTRAUTERINE | Status: AC
  Administered 2018-03-15: 16:00:00 via INTRAUTERINE

## 2018-03-15 NOTE — Progress Notes (Signed)
Mirena IUD Insertion   The pt presents for Mirena IUD placement.  No contraindications for placement.   The patient took 10 mg Flexeril prior to appt.   No LMP recorded.  UHCG: negative  Last unprotected sex:  NA  Risks & benefits of IUD discussed  The IUD was purchased and supplied by [facility].  Packaging instructions supplied to patient  Consent form signed.  The patient denies any allergies to anesthetics or antiseptics.   Procedure:  Pt was placed in lithotomy position.  Speculum was inserted.  GC/CT swab was used to collect sample for STI testing.  Tenaculum was used to stabilize the cervix by clasping at 12 o'clock  Betadine was used to clean the cervix and cervical os.  Dilators were used. The uterus was sounded to 7 cm.  Mirena was inserted using manufacturer provided applicator. Lot # [id number]  Strings were trimmed to 3 cm external to os.  Tenaculum was removed.  Speculum was removed.   The patient was advised to move slowly from a supine to an upright position   The patient denied any concerns or complaints   The patient was instructed to schedule a follow-up appt in 1 month and to call sooner if any concerns.   The patient acknowledged agreement and understanding of the plan.

## 2018-03-15 NOTE — Addendum Note (Signed)
Addended by: Vivi Barrack on: 03/15/2018 02:53 PM   Modules accepted: Orders

## 2018-03-15 NOTE — Patient Instructions (Signed)

## 2018-03-17 ENCOUNTER — Ambulatory Visit: Payer: Medicaid Other | Admitting: Family

## 2018-03-17 LAB — C. TRACHOMATIS/N. GONORRHOEAE RNA
C. trachomatis RNA, TMA: NOT DETECTED
N. gonorrhoeae RNA, TMA: NOT DETECTED

## 2018-03-24 ENCOUNTER — Encounter: Payer: Self-pay | Admitting: Family

## 2018-03-28 ENCOUNTER — Encounter: Payer: Self-pay | Admitting: Family

## 2018-03-28 NOTE — Addendum Note (Signed)
Addended by: Fanny Dance on: 03/28/2018 04:30 PM   Modules accepted: Level of Service

## 2018-04-03 ENCOUNTER — Other Ambulatory Visit: Payer: Self-pay | Admitting: Pediatrics

## 2018-04-03 NOTE — Telephone Encounter (Signed)
Request sent in error from the pharmacy. She is having light spotting but she had not requested refill.

## 2018-04-03 NOTE — Telephone Encounter (Signed)
Shouldn't be needing provera anymore with the IUD... Will you see how much she is bleeding please?

## 2018-04-12 ENCOUNTER — Ambulatory Visit (INDEPENDENT_AMBULATORY_CARE_PROVIDER_SITE_OTHER): Payer: Medicaid Other | Admitting: Family

## 2018-04-12 ENCOUNTER — Encounter: Payer: Self-pay | Admitting: Family

## 2018-04-12 VITALS — BP 105/75 | HR 115 | Ht 62.6 in | Wt 120.8 lb

## 2018-04-12 DIAGNOSIS — Z975 Presence of (intrauterine) contraceptive device: Secondary | ICD-10-CM | POA: Diagnosis not present

## 2018-04-12 DIAGNOSIS — L810 Postinflammatory hyperpigmentation: Secondary | ICD-10-CM | POA: Diagnosis not present

## 2018-04-12 NOTE — Progress Notes (Signed)
History was provided by the patient.  Brandy Howard is a 22 y.o. female who is here for IUS string check.   PCP confirmed? Yes.    Nolene Ebbs, MD  HPI:   -here for string check -having some cramping, improved.  -has been using a heating pad  -has markings on her abdomen from heating pad use, skin is intact, no blistering -bleeding has been light/spotting since her IUD insertion -no other concerns  Review of Systems  Constitutional: Negative for malaise/fatigue.  Eyes: Negative for double vision.  Respiratory: Negative for shortness of breath.   Cardiovascular: Negative for chest pain and palpitations.  Gastrointestinal: Negative for abdominal pain, constipation, diarrhea, nausea and vomiting.  Genitourinary: Negative for dysuria.  Musculoskeletal: Negative for joint pain and myalgias.  Skin: Negative for rash.  Neurological: Negative for dizziness and headaches.  Endo/Heme/Allergies: Does not bruise/bleed easily.    Patient Active Problem List   Diagnosis Date Noted  . Abnormal uterine bleeding (AUB) 05/20/2017  . Herpes infection 07/03/2014  . Anemia 06/30/2014  . Symptomatic anemia 06/30/2014    Current Outpatient Medications on File Prior to Visit  Medication Sig Dispense Refill  . cyclobenzaprine (FLEXERIL) 10 MG tablet Take 1 tablet (10 mg total) by mouth 3-4 hours prior to your scheduled appointment. 30 tablet 0  . ferrous sulfate (FE TABS) 325 (65 FE) MG EC tablet Take 1 tablet (325 mg total) by mouth 3 (three) times daily with meals. (Patient not taking: Reported on 04/12/2018) 90 tablet 0  . ibuprofen (ADVIL,MOTRIN) 600 MG tablet Take 1 tablet (600 mg total) by mouth every 6 (six) hours as needed. (Patient not taking: Reported on 04/12/2018) 30 tablet 0  . lidocaine (XYLOCAINE) 2 % jelly Apply 1 application topically 3 (three) times daily as needed. (Patient not taking: Reported on 02/08/2018) 30 mL 1  . medroxyPROGESTERone (PROVERA) 10 MG tablet TAKE 1 TABLET  (10 MG TOTAL) BY MOUTH DAILY FOR 6 DOSES. (Patient not taking: Reported on 03/08/2018) 6 tablet 0  . medroxyPROGESTERone (PROVERA) 10 MG tablet Take 1 tablet (10 mg total) by mouth daily. (Patient not taking: Reported on 04/12/2018) 10 tablet 0  . norgestimate-ethinyl estradiol (ORTHO-CYCLEN,SPRINTEC,PREVIFEM) 0.25-35 MG-MCG tablet Take 1 tablet by mouth daily. (Patient not taking: Reported on 03/08/2018) 1 Package 11  . PRESCRIPTION MEDICATION Take 1 tablet by mouth daily. turmeric & ginger     No current facility-administered medications on file prior to visit.     Allergies  Allergen Reactions  . Dust Mite Extract Itching  . Latex Swelling    Physical Exam:    Vitals:   04/12/18 1440  BP: 105/75  Pulse: (!) 115  Weight: 120 lb 12.8 oz (54.8 kg)  Height: 5' 2.6" (1.59 m)    Growth percentile SmartLinks can only be used for patients less than 13 years old. No LMP recorded.  Physical Exam  Constitutional: She appears well-developed.  HENT:  Mouth/Throat: Oropharynx is clear and moist. No oropharyngeal exudate.  Eyes: Pupils are equal, round, and reactive to light.  Neck: Normal range of motion.  Cardiovascular: Regular rhythm.  No murmur heard. Pulmonary/Chest: Effort normal.  Abdominal: Soft. There is no tenderness.  Lymphadenopathy:    She has no cervical adenopathy.  Skin:  Scalloped hyperpigmented areas noted diffuse on abdomen, skin dry and intact.   Psychiatric: She has a normal mood and affect.     Assessment/Plan: 1. IUD (intrauterine device) in place -no string check today; pt declined -return precautions given  2. Hyperpigmentation of skin, postinflammatory -avoid heating pad -return precautions  -will refer to derm if longer than 4-6 weeks to resolve

## 2018-05-10 ENCOUNTER — Encounter (HOSPITAL_COMMUNITY): Payer: Self-pay

## 2018-05-10 ENCOUNTER — Other Ambulatory Visit: Payer: Self-pay

## 2018-05-10 ENCOUNTER — Ambulatory Visit (HOSPITAL_COMMUNITY)
Admission: EM | Admit: 2018-05-10 | Discharge: 2018-05-10 | Disposition: A | Discharge status: Home / Self Care | Payer: Medicaid Other | Attending: Family Medicine | Admitting: Family Medicine

## 2018-05-10 DIAGNOSIS — M222X1 Patellofemoral disorders, right knee: Secondary | ICD-10-CM

## 2018-05-10 DIAGNOSIS — M25562 Pain in left knee: Secondary | ICD-10-CM

## 2018-05-10 DIAGNOSIS — G8929 Other chronic pain: Secondary | ICD-10-CM

## 2018-05-10 DIAGNOSIS — M25561 Pain in right knee: Secondary | ICD-10-CM

## 2018-05-10 DIAGNOSIS — M222X2 Patellofemoral disorders, left knee: Secondary | ICD-10-CM

## 2018-05-10 DIAGNOSIS — M62838 Other muscle spasm: Secondary | ICD-10-CM

## 2018-05-10 MED ORDER — CYCLOBENZAPRINE HCL 10 MG PO TABS: 10.0000 mg | ORAL_TABLET | Freq: Every evening | ORAL | 0 refills | Status: DC | PRN

## 2018-05-10 MED ORDER — NAPROXEN 375 MG PO TABS: 375.0000 mg | ORAL_TABLET | Freq: Two times a day (BID) | ORAL | 0 refills | Status: DC
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
## 2018-05-10 NOTE — ED Notes (Signed)
Patient's bilateral knee sleeves applied by tameka cma

## 2018-05-10 NOTE — Discharge Instructions (Addendum)
Braces given Continue conservative management of rest, ice, heat, and gentle stretches Take naproxen as needed for pain relief (may cause abdominal discomfort, ulcers, and GI bleeds avoid taking with other NSAIDs) Take cyclobenzaprine at nighttime for symptomatic relief. Avoid driving or operating heavy machinery while using medication. Follow up with PCP if symptoms persist Return or go to the ER if you have any new or worsening symptoms (fever, chills, chest pain, abdominal pain, changes in bowel or bladder habits, pain radiating into lower legs, etc...)

## 2018-05-24 ENCOUNTER — Telehealth: Payer: Self-pay

## 2018-05-24 NOTE — Telephone Encounter (Signed)
Patient called asking for advice on "vaginal steaming" with IUD. Per current research, steaming is contraindicated with IUD due to steam increasing shedding of the uterine lining which could cause the IUD to be expelled. Called number on file, no answer, left VM to call office back.

## 2018-12-05 ENCOUNTER — Emergency Department (HOSPITAL_COMMUNITY)
Admission: EM | Admit: 2018-12-05 | Discharge: 2018-12-05 | Disposition: A | Discharge status: Home / Self Care | Payer: 59 | Attending: Emergency Medicine | Admitting: Emergency Medicine

## 2018-12-05 ENCOUNTER — Encounter (HOSPITAL_COMMUNITY): Payer: Self-pay

## 2018-12-05 ENCOUNTER — Emergency Department (HOSPITAL_COMMUNITY): Payer: 59

## 2018-12-05 DIAGNOSIS — Z79899 Other long term (current) drug therapy: Secondary | ICD-10-CM | POA: Diagnosis not present

## 2018-12-05 DIAGNOSIS — N760 Acute vaginitis: Secondary | ICD-10-CM | POA: Diagnosis not present

## 2018-12-05 DIAGNOSIS — R69 Illness, unspecified: Secondary | ICD-10-CM

## 2018-12-05 DIAGNOSIS — B9689 Other specified bacterial agents as the cause of diseases classified elsewhere: Secondary | ICD-10-CM

## 2018-12-05 DIAGNOSIS — J101 Influenza due to other identified influenza virus with other respiratory manifestations: Secondary | ICD-10-CM | POA: Insufficient documentation

## 2018-12-05 DIAGNOSIS — J111 Influenza due to unidentified influenza virus with other respiratory manifestations: Secondary | ICD-10-CM

## 2018-12-05 DIAGNOSIS — Z87891 Personal history of nicotine dependence: Secondary | ICD-10-CM | POA: Diagnosis not present

## 2018-12-05 DIAGNOSIS — R05 Cough: Secondary | ICD-10-CM | POA: Diagnosis present

## 2018-12-05 LAB — CBC WITH DIFFERENTIAL/PLATELET
Abs Immature Granulocytes: 0.01 10*3/uL (ref 0.00–0.07)
BASOS ABS: 0.1 10*3/uL (ref 0.0–0.1)
Basophils Relative: 1 %
Eosinophils Absolute: 0.1 10*3/uL (ref 0.0–0.5)
Eosinophils Relative: 2 %
HEMATOCRIT: 44 % (ref 36.0–46.0)
Hemoglobin: 14 g/dL (ref 12.0–15.0)
Immature Granulocytes: 0 %
LYMPHS ABS: 1.6 10*3/uL (ref 0.7–4.0)
Lymphocytes Relative: 35 %
MCH: 27.7 pg (ref 26.0–34.0)
MCHC: 31.8 g/dL (ref 30.0–36.0)
MCV: 87.1 fL (ref 80.0–100.0)
Monocytes Absolute: 0.4 10*3/uL (ref 0.1–1.0)
Monocytes Relative: 9 %
Neutro Abs: 2.4 10*3/uL (ref 1.7–7.7)
Neutrophils Relative %: 53 %
Platelets: 325 10*3/uL (ref 150–400)
RBC: 5.05 MIL/uL (ref 3.87–5.11)
RDW: 13.2 % (ref 11.5–15.5)
WBC: 4.5 10*3/uL (ref 4.0–10.5)
nRBC: 0 % (ref 0.0–0.2)

## 2018-12-05 LAB — WET PREP, GENITAL
TRICH WET PREP: NONE SEEN
Yeast Wet Prep HPF POC: NONE SEEN

## 2018-12-05 LAB — COMPREHENSIVE METABOLIC PANEL
ALT: 12 U/L (ref 0–44)
AST: 21 U/L (ref 15–41)
Albumin: 4.5 g/dL (ref 3.5–5.0)
Alkaline Phosphatase: 63 U/L (ref 38–126)
Anion gap: 7 (ref 5–15)
BUN: 13 mg/dL (ref 6–20)
CO2: 25 mmol/L (ref 22–32)
Calcium: 9.6 mg/dL (ref 8.9–10.3)
Chloride: 104 mmol/L (ref 98–111)
Creatinine, Ser: 0.77 mg/dL (ref 0.44–1.00)
GFR calc Af Amer: >60 mL/min (ref >60)
GFR calc non Af Amer: >60 mL/min (ref >60)
Glucose, Bld: 91 mg/dL (ref 70–99)
Potassium: 4 mmol/L (ref 3.5–5.1)
Sodium: 136 mmol/L (ref 135–145)
Total Bilirubin: 0.7 mg/dL (ref 0.3–1.2)
Total Protein: 8.5 g/dL — ABNORMAL HIGH (ref 6.5–8.1)

## 2018-12-05 LAB — URINALYSIS, ROUTINE W REFLEX MICROSCOPIC
Bilirubin Urine: NEGATIVE
Glucose, UA: NEGATIVE mg/dL
Hgb urine dipstick: NEGATIVE
Ketones, ur: NEGATIVE mg/dL
Leukocytes,Ua: NEGATIVE
Nitrite: NEGATIVE
Protein, ur: NEGATIVE mg/dL
Specific Gravity, Urine: 1.003 — ABNORMAL LOW (ref 1.005–1.030)
pH: 7 (ref 5.0–8.0)

## 2018-12-05 LAB — I-STAT BETA HCG BLOOD, ED (MC, WL, AP ONLY): I-stat hCG, quantitative: <5 m[IU]/mL (ref <5)

## 2018-12-05 MED ORDER — ONDANSETRON HCL 4 MG/2ML IJ SOLN
4.0000 mg | Freq: Once | INTRAMUSCULAR | Status: AC
  Administered 2018-12-05: 4 mg via INTRAVENOUS
  Filled 2018-12-05: qty 2

## 2018-12-05 MED ORDER — LORAZEPAM 1 MG PO TABS
1.0000 mg | ORAL_TABLET | Freq: Once | ORAL | Status: AC
  Administered 2018-12-05: 1 mg via ORAL
  Filled 2018-12-05: qty 1

## 2018-12-05 MED ORDER — SODIUM CHLORIDE 0.9 % IV BOLUS
1000.0000 mL | Freq: Once | INTRAVENOUS | Status: AC
  Administered 2018-12-05: 1000 mL via INTRAVENOUS

## 2018-12-05 MED ORDER — BENZONATATE 100 MG PO CAPS: 100.0000 mg | ORAL_CAPSULE | Freq: Three times a day (TID) | ORAL | 0 refills | Status: DC

## 2018-12-05 MED ORDER — ALBUTEROL SULFATE HFA 108 (90 BASE) MCG/ACT IN AERS
2.0000 | INHALATION_SPRAY | Freq: Once | RESPIRATORY_TRACT | Status: AC
  Administered 2018-12-05: 2 via RESPIRATORY_TRACT
  Filled 2018-12-05: qty 6.7

## 2018-12-05 MED ORDER — KETOROLAC TROMETHAMINE 15 MG/ML IJ SOLN
15.0000 mg | Freq: Once | INTRAMUSCULAR | Status: AC
  Administered 2018-12-05: 15 mg via INTRAVENOUS
  Filled 2018-12-05: qty 1

## 2018-12-05 MED ORDER — METRONIDAZOLE 500 MG PO TABS: 500.0000 mg | ORAL_TABLET | Freq: Two times a day (BID) | ORAL | 0 refills | Status: DC

## 2018-12-05 MED ORDER — FLUTICASONE PROPIONATE 50 MCG/ACT NA SUSP: 1.0000 | Freq: Every day | NASAL | 0 refills | Status: DC

## 2018-12-05 MED ORDER — NAPROXEN 500 MG PO TABS: 500.0000 mg | ORAL_TABLET | Freq: Two times a day (BID) | ORAL | 0 refills | Status: DC

## 2018-12-05 NOTE — ED Triage Notes (Addendum)
Patient arrived via [facility] from primary doctor  Patient has been feeling "crudy" since Friday   Today patient c/o worse shob and generalized weakness  Having difficulty speaking full sentences per ems   Patient states it might feel like a panic attack but this feels a little different  Patient states she has been hot and cold on and off.   Lung sounds clear per ems  Denies cough   Hx. Anxiety, asthma, anemia, and seasonal allergies   Patient does not take any daily medications per ems

## 2018-12-05 NOTE — ED Provider Notes (Signed)
[facility] DEPT Provider Note   CSN: [id number] Arrival date & time: 12/05/18  1140    History   Chief Complaint Chief Complaint  Patient presents with   URI    HPI Brandy Howard is a 23 y.o. female with a hx of anemia, anxiety, depression, and asthma who presents to the ED via EMS from PCP office with multiple complaints somewhat of flu like sxs.   Patient reports congestion, ear pressure, sore throat, mild dry cough, chest tightness, difficulty breathing, subjective fevers, generalized body aches, fatigue/generalized weakness, lightheadedness, & nausea. Sxs constant. No specific alleviating/aggravating factors. Taking tylenol at home without much relief. Was at PCP office and states she felt lightheaded like she may pass out therefore EMS was called. She states this was somewhat like her prior panic attacks, EMS felt that she was having trouble speaking en route. No syncope occurred. No intervention en route. Denies palpitations, emesis, diarrhea, dysuria, leg pain/swelling, hemoptysis, recent surgery/trauma, recent long travel, hormone use, personal hx of cancer, or hx of DVT/PE. On further questioning patient notes some pelvic pain consistent with intermittent cramps she experiences with her IUD, no acute change states this is typical for her, also has had some vaginal discharge that is white. Sexually active w/ a female partner in monogamous relationship without concern for STD. She does not get her period w/ her IUD.      HPI  Past Medical History:  Diagnosis Date   Anemia    Anxiety    Asthma    Depression    doing ok   Eczema    Genital herpes    Seasonal allergies     Patient Active Problem List   Diagnosis Date Noted   Abnormal uterine bleeding (AUB) 05/20/2017   Herpes infection 07/03/2014   Anemia 06/30/2014   Symptomatic anemia 06/30/2014    Past Surgical History:  Procedure Laterality Date   NO PAST SURGERIES         OB History    Gravida  0   Para  0   Term  0   Preterm  0   AB  0   Living  0     SAB  0   TAB  0   Ectopic  0   Multiple  0   Live Births               Home Medications    Prior to Admission medications   Medication Sig Start Date End Date Taking? Authorizing Provider  cyclobenzaprine (FLEXERIL) 10 MG tablet Take 1 tablet (10 mg total) by mouth at bedtime as needed for muscle spasms. 05/10/18   Wurst, [place], PA-C  naproxen (NAPROSYN) 375 MG tablet Take 1 tablet (375 mg total) by mouth 2 (two) times daily. 05/10/18   Wurst, [place], PA-C  PRESCRIPTION MEDICATION Take 1 tablet by mouth daily. turmeric & ginger    [provider]    Family History Family History  Adopted: Yes  Family history unknown: Yes    Social History Social History   Tobacco Use   Smoking status: Former Smoker    Types: Cigars   Smokeless tobacco: Never Used   Tobacco comment: April 2017  Substance Use Topics   Alcohol use: Yes    Comment: Social    Drug use: No     Allergies   Dust mite extract and Latex   Review of Systems Review of Systems  Constitutional: Positive for chills, fatigue and fever (  subjective).  HENT: Positive for congestion, ear pain and sore throat. Negative for trouble swallowing and voice change.   Respiratory: Positive for cough, chest tightness and shortness of breath. Negative for wheezing.   Cardiovascular: Negative for leg swelling.  Genitourinary: Positive for vaginal discharge. Negative for dysuria and vaginal bleeding.  Musculoskeletal: Positive for myalgias (generalized).  Skin: Negative for rash.  Neurological: Positive for weakness (generalized) and light-headedness. Negative for syncope.  All other systems reviewed and are negative.    Physical Exam Updated Vital Signs BP 128/74    Pulse 92    Temp 98.5 F (36.9 C) (Oral)    Resp 17    SpO2 100%   Physical Exam Vitals signs and nursing note reviewed. Exam  conducted with a chaperone present.  Constitutional:      General: She is not in acute distress.    Appearance: She is well-developed.  HENT:     Head: Normocephalic and atraumatic.     Right Ear: Ear canal normal. Tympanic membrane is not perforated, erythematous, retracted or bulging.     Left Ear: Ear canal normal. Tympanic membrane is not perforated, erythematous, retracted or bulging.     Ears:     Comments: No mastoid erythema/swelling/tenderness.     Nose: Congestion present.     Right Sinus: No maxillary sinus tenderness or frontal sinus tenderness.     Left Sinus: No maxillary sinus tenderness or frontal sinus tenderness.     Mouth/Throat:     Pharynx: Uvula midline. No oropharyngeal exudate or posterior oropharyngeal erythema.     Comments: Posterior oropharynx is symmetric appearing. Patient tolerating own secretions without difficulty. No trismus. No drooling. No hot potato voice. No swelling beneath the tongue, submandibular compartment is soft.  Eyes:     General:        Right eye: No discharge.        Left eye: No discharge.     Conjunctiva/sclera: Conjunctivae normal.     Pupils: Pupils are equal, round, and reactive to light.  Neck:     Musculoskeletal: Normal range of motion and neck supple. No edema or neck rigidity.  Cardiovascular:     Rate and Rhythm: Normal rate and regular rhythm.     Heart sounds: No murmur.  Pulmonary:     Effort: Pulmonary effort is normal. No respiratory distress.     Breath sounds: Normal breath sounds. No wheezing, rhonchi or rales.     Comments: SpO2 100% on RA. Patient is speaking in clear sentences without difficulty or evidence or respiratory distress.  Abdominal:     General: There is no distension.     Palpations: Abdomen is soft.     Tenderness: There is abdominal tenderness (mild) in the suprapubic area. There is no right CVA tenderness, left CVA tenderness, guarding or rebound. Negative signs include McBurney's sign.    Genitourinary:    Exam position: Supine.     Vagina: Vaginal discharge (mild white) present.     Cervix: No friability.     Adnexa:        Right: No mass or tenderness.         Left: No mass or tenderness.       Comments: IUD strings visualized. Diffusely uncomfortable w/ pelvic exam. No CMT.  Lymphadenopathy:     Cervical: No cervical adenopathy.  Skin:    General: Skin is warm and dry.     Findings: No rash.  Neurological:     Mental Status:  She is alert.  Psychiatric:        Behavior: Behavior normal.    ED Treatments / Results  Labs (all labs ordered are listed, but only abnormal results are displayed) Labs Reviewed  WET PREP, GENITAL - Abnormal; Notable for the following components:      Result Value   Clue Cells Wet Prep HPF POC PRESENT (*)    WBC, Wet Prep HPF POC MANY (*)    All other components within normal limits  COMPREHENSIVE METABOLIC PANEL - Abnormal; Notable for the following components:   Total Protein 8.5 (*)    All other components within normal limits  URINALYSIS, ROUTINE W REFLEX MICROSCOPIC - Abnormal; Notable for the following components:   Color, Urine STRAW (*)    Specific Gravity, Urine 1.003 (*)    All other components within normal limits  CBC WITH DIFFERENTIAL/PLATELET  I-STAT BETA HCG BLOOD, ED ([facility], [facility], [facility] ONLY)  GC/CHLAMYDIA PROBE AMP ([place]) NOT AT [facility]    EKG ED ECG REPORT   Date: 12/05/2018  Rate: 84  Rhythm: normal sinus rhythm  QRS Axis: normal  Intervals: normal  ST/T Wave abnormalities: normal  Conduction Disutrbances:none  Narrative Interpretation:   Radiology Dg Chest Portable 1 View  Result Date: 12/05/2018 CLINICAL DATA:  Shortness of breath for several days EXAM: PORTABLE CHEST 1 VIEW COMPARISON:  02/10/2018 FINDINGS: The heart size and mediastinal contours are within normal limits. Both lungs are clear. The visualized skeletal structures are unremarkable. IMPRESSION: No active disease. Electronically Signed    By: Inez Catalina M.D.   On: 12/05/2018 13:14    Procedures Procedures (including critical care time)  Medications Ordered in ED Medications  albuterol (PROVENTIL HFA;VENTOLIN HFA) 108 (90 Base) MCG/ACT inhaler 2 puff (2 puffs Inhalation Given 12/05/18 1342)  LORazepam (ATIVAN) tablet 1 mg (1 mg Oral Given 12/05/18 1259)  sodium chloride 0.9 % bolus 1,000 mL (0 mLs Intravenous Stopped 12/05/18 1346)  ketorolac (TORADOL) 15 MG/ML injection 15 mg (15 mg Intravenous Given 12/05/18 1341)  ondansetron (ZOFRAN) injection 4 mg (4 mg Intravenous Given 12/05/18 1342)     Initial Impression / Assessment and Plan / ED Course  I have reviewed the triage vital signs and the nursing notes.  Pertinent labs & imaging results that were available during my care of the patient were reviewed by me and considered in my medical decision making (see chart for details).   Patient presents to the ED with multiple complaints. Nontoxic appearing, no apparent distress, vitals WNL on my exam. Detailed physical above. Labs including pelvic swabs, CXR, meds, re-assess.   No sinus tenderness, afebrile in the ED, sxs < 7 days- does not seem consistent with bacterial sinusitis. No AOM, AOE, or mastoiditis on exam. No meningismus. Lungs CTA- CXR negative for infiltrate to suggest pneumonia, also negative for pneumothorax, effusion, edema, or fracture/dislocation. No wheezing or poor air movement to suggest asthma exacerbation requiring steroids, trial of inhaler given her hx of asthma with some improvement which patient will be discharged home with.  EKG non concerning. Labs are unremarkable: no anemia, leukocytosis, or electrolyte derangement. LFTs & renal function WNL. Preg test negative- doubt ectopic. UA without UTI. Patient has mild suprapubic tenderness and diffuse discomfort w/ pelvic exam. Non surgical abdomen- does not seem consistent with cholecystitis, pancreatitis, obstruction/perforation, or appendicitis. Given discharge  w/ pelvic pain did consider PID and [place] for further eval; however patient states pelvic pain is identical to her typical pelvic cramps she gets with  her period which still occur in a milder form with her IUD she declined [place] which I think is reasonable. Wet prep showed BV- will treat w/ flagyl. GC/chlamydia pending. No trich on wet prep/UA.  Overall she is feeling improved following interventions in the ER. Again feel sxs seem flu like in nature. Flu swab deferred given patient is outside of 48 hour tx window for tamiflu. Also considered coronavirus, no known exposure/travel, does not meet current hospital criteria for testing, precautionary quarantine.  Will discharge home with supportive measures. I discussed results, treatment plan, need for follow-up, and return precautions with the patient. Provided opportunity for questions, patient confirmed understanding and is in agreement with plan.    Final Clinical Impressions(s) / ED Diagnoses   Final diagnoses:  Influenza-like illness  Bacterial vaginosis    ED Discharge Orders         Ordered    fluticasone (FLONASE) 50 MCG/ACT nasal spray  Daily     12/05/18 1509    naproxen (NAPROSYN) 500 MG tablet  2 times daily     12/05/18 1509    benzonatate (TESSALON) 100 MG capsule  Every 8 hours     12/05/18 1509    metroNIDAZOLE (FLAGYL) 500 MG tablet  2 times daily     12/05/18 [address] PA-C 12/05/18 [place]    Jola Schmidt, MD 12/06/18 [phone]

## 2018-12-05 NOTE — ED Notes (Signed)
PA at bedside.

## 2018-12-05 NOTE — Discharge Instructions (Addendum)
You were seen in the emergency department today for flulike symptoms.  Your work-up in the ER was overall reassuring.  Your labs were all fairly normal.  Your wet prep, the lab we performed with your pelvic exam showed bacterial vaginosis, please see the attached handout regarding this diagnosis.  We are treating this with Flagyl, take this as prescribed, do not consume alcohol with this medicine.  The remainder of your symptoms we think are related to a flulike illness.  Chest x-ray was normal.  We are sending you home with the following medicines: - Flonase: This is a decongestant, use 1 spray per nostril daily - Tessalon: This is an anti-cough medicine, take it every 8 hours as needed - Naproxen is a nonsteroidal anti-inflammatory medication that will help with pain and swelling. Be sure to take this medication as prescribed with food, 1 pill every 12 hours,  It should be taken with food, as it can cause stomach upset, and more seriously, stomach bleeding. Do not take other nonsteroidal anti-inflammatory medications with this such as Advil, Motrin, Aleve, Mobic, Goodie Powder, or Motrin.    You make take Tylenol per over the counter dosing with these medications.  Please also use the inhaler that you received in the ER 1 to 2 puffs every 8 hours as needed for shortness of breath or wheezing.  We have prescribed you new medication(s) today. Discuss the medications prescribed today with your pharmacist as they can have adverse effects and interactions with your other medicines including over the counter and prescribed medications. Seek medical evaluation if you start to experience new or abnormal symptoms after taking one of these medicines, seek care immediately if you start to experience difficulty breathing, feeling of your throat closing, facial swelling, or rash as these could be indications of a more serious allergic reaction  Follow up with primary care within 2 to 3 days for reevaluation of your  symptoms.  Return to the ER for new or worsening symptoms including but not limited to worsening pain, inability to keep fluids down, passing out, or any other concerns.   We have limited coronavirus testing at this time. We are instructing patient's with cough to quarantine themselves for 14 days. You may be able to discontinue self quarantine if the following conditions are met:   Persons with COVID-19 who have symptoms and were directed to care for themselves at home may discontinue home isolation under the  following conditions: - It has been at least 7 days have passed since symptoms first appeared. - AND at least 3 days (72 hours) have passed since recovery defined as resolution of fever without the use of fever-reducing medications and improvement in respiratory symptoms (e.g., cough, shortness of breath)  Please follow the below quarantine instructions.   Stay home except to get medical care You should restrict activities outside your home, except for getting medical care. Do not go to work, school, or public areas, and do not use public transportation or taxis.  Call ahead before visiting your doctor Before your medical appointment, call the healthcare provider and tell them that you have, or are being evaluated for, COVID-19 infection. This will help the healthcare providers office take steps to keep other people from getting infected. Ask your healthcare provider to call the local or state health department.  Monitor your symptoms Seek prompt medical attention if your illness is worsening (e.g., difficulty breathing). Before going to your medical appointment, call the healthcare provider and tell them that you  have, or are being evaluated for, COVID-19 infection. Ask your healthcare provider to call the local or state health department.  Wear a facemask You should wear a facemask that covers your nose and mouth when you are in the same room with other people and when you visit  a healthcare provider. People who live with or visit you should also wear a facemask while they are in the same room with you.  Separate yourself from other people in your home As much as possible, you should stay in a different room from other people in your home. Also, you should use a separate bathroom, if available.  Avoid sharing household items You should not share dishes, drinking glasses, cups, eating utensils, towels, bedding, or other items with other people in your home. After using these items, you should wash them thoroughly with soap and water.  Cover your coughs and sneezes Cover your mouth and nose with a tissue when you cough or sneeze, or you can cough or sneeze into your sleeve. Throw used tissues in a lined trash can, and immediately wash your hands with soap and water for at least 20 seconds or use an alcohol-based hand rub.  Wash your [facility] your hands often and thoroughly with soap and water for at least 20 seconds. You can use an alcohol-based hand sanitizer if soap and water are not available and if your hands are not visibly dirty. Avoid touching your eyes, nose, and mouth with unwashed hands.   Prevention Steps for Caregivers and Household Members of Individuals Confirmed to have, or Being Evaluated for, COVID-19 Infection Being Cared for in the Home  If you live with, or provide care at home for, a person confirmed to have, or being evaluated for, COVID-19 infection please follow these guidelines to prevent infection:  Follow healthcare providers instructions Make sure that you understand and can help the patient follow any healthcare provider instructions for all care.  Provide for the patients basic needs You should help the patient with basic needs in the home and provide support for getting groceries, prescriptions, and other personal needs.  Monitor the patients symptoms If they are getting sicker, call his or her medical provider and tell  them that the patient has, or is being evaluated for, COVID-19 infection. This will help the healthcare providers office take steps to keep other people from getting infected. Ask the healthcare provider to call the local or state health department.  Limit the number of people who have contact with the patient If possible, have only one caregiver for the patient. Other household members should stay in another home or place of residence. If this is not possible, they should stay in another room, or be separated from the patient as much as possible. Use a separate bathroom, if available. Restrict visitors who do not have an essential need to be in the home.  Keep older adults, very young children, and other sick people away from the patient Keep older adults, very young children, and those who have compromised immune systems or chronic health conditions away from the patient. This includes people with chronic heart, lung, or kidney conditions, diabetes, and cancer.  Ensure good ventilation Make sure that shared spaces in the home have good air flow, such as from an air conditioner or an opened window, weather permitting.  Wash your hands often Wash your hands often and thoroughly with soap and water for at least 20 seconds. You can use an alcohol based hand sanitizer  if soap and water are not available and if your hands are not visibly dirty. Avoid touching your eyes, nose, and mouth with unwashed hands. Use disposable paper towels to dry your hands. If not available, use dedicated cloth towels and replace them when they become wet.  Wear a facemask and gloves Wear a disposable facemask at all times in the room and gloves when you touch or have contact with the patients blood, body fluids, and/or secretions or excretions, such as sweat, saliva, sputum, nasal mucus, vomit, urine, or feces.  Ensure the mask fits over your nose and mouth tightly, and do not touch it during use. Throw out  disposable facemasks and gloves after using them. Do not reuse. Wash your hands immediately after removing your facemask and gloves. If your personal clothing becomes contaminated, carefully remove clothing and launder. Wash your hands after handling contaminated clothing. Place all used disposable facemasks, gloves, and other waste in a lined container before disposing them with other household waste. Remove gloves and wash your hands immediately after handling these items.  Do not share dishes, glasses, or other household items with the patient Avoid sharing household items. You should not share dishes, drinking glasses, cups, eating utensils, towels, bedding, or other items with a patient who is confirmed to have, or being evaluated for, COVID-19 infection. After the person uses these items, you should wash them thoroughly with soap and water.  Wash laundry thoroughly Immediately remove and wash clothes or bedding that have blood, body fluids, and/or secretions or excretions, such as sweat, saliva, sputum, nasal mucus, vomit, urine, or feces, on them. Wear gloves when handling laundry from the patient. Read and follow directions on labels of laundry or clothing items and detergent. In general, wash and dry with the warmest temperatures recommended on the label.  Clean all areas the individual has used often Clean all touchable surfaces, such as counters, tabletops, doorknobs, bathroom fixtures, toilets, phones, keyboards, tablets, and bedside tables, every day. Also, clean any surfaces that may have blood, body fluids, and/or secretions or excretions on them. Wear gloves when cleaning surfaces the patient has come in contact with. Use a diluted bleach solution (e.g., dilute bleach with 1 part bleach and 10 parts water) or a household disinfectant with a label that says EPA-registered for coronaviruses. To make a bleach solution at home, add 1 tablespoon of bleach to 1 quart (4 cups) of water.  For a larger supply, add  cup of bleach to 1 gallon (16 cups) of water. Read labels of cleaning products and follow recommendations provided on product labels. Labels contain instructions for safe and effective use of the cleaning product including precautions you should take when applying the product, such as wearing gloves or eye protection and making sure you have good ventilation during use of the product. Remove gloves and wash hands immediately after cleaning.  Monitor yourself for signs and symptoms of illness Caregivers and household members are considered close contacts, should monitor their health, and will be asked to limit movement outside of the home to the extent possible. Follow the monitoring steps for close contacts listed on the symptom monitoring form.   ? If you have additional questions, contact your local health department or call the epidemiologist on call at [phone] (available 24/7). ? This guidance is subject to change. For the most up-to-date guidance from [facility], please refer to their website: [email or web address]

## 2018-12-06 LAB — GC/CHLAMYDIA PROBE AMP (~~LOC~~) NOT AT ARMC
Chlamydia: POSITIVE — AB
Neisseria Gonorrhea: NEGATIVE

## 2018-12-11 ENCOUNTER — Telehealth: Payer: Self-pay | Admitting: Student

## 2018-12-11 DIAGNOSIS — A749 Chlamydial infection, unspecified: Secondary | ICD-10-CM

## 2018-12-11 MED ORDER — AZITHROMYCIN 500 MG PO TABS: 1000.0000 mg | ORAL_TABLET | Freq: Once | ORAL | 0 refills | Status: AC

## 2018-12-11 NOTE — Telephone Encounter (Addendum)
Brandy Howard tested positive for  Chlamydia. Patient was called by RN and allergies and pharmacy confirmed. Rx sent to pharmacy of choice.   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 Please send Rx.   Thank you,   Bjorn Loser, RN   Results faxed to [facility].

## 2019-03-04 ENCOUNTER — Emergency Department (HOSPITAL_COMMUNITY): Payer: 59

## 2019-03-04 ENCOUNTER — Other Ambulatory Visit: Payer: Self-pay

## 2019-03-04 ENCOUNTER — Encounter (HOSPITAL_COMMUNITY): Payer: Self-pay | Admitting: Emergency Medicine

## 2019-03-04 ENCOUNTER — Emergency Department (HOSPITAL_COMMUNITY)
Admission: EM | Admit: 2019-03-04 | Discharge: 2019-03-05 | Disposition: A | Discharge status: Home / Self Care | Payer: 59 | Attending: Emergency Medicine | Admitting: Emergency Medicine

## 2019-03-04 DIAGNOSIS — J45909 Unspecified asthma, uncomplicated: Secondary | ICD-10-CM | POA: Diagnosis not present

## 2019-03-04 DIAGNOSIS — N83291 Other ovarian cyst, right side: Secondary | ICD-10-CM | POA: Insufficient documentation

## 2019-03-04 DIAGNOSIS — Z87891 Personal history of nicotine dependence: Secondary | ICD-10-CM | POA: Diagnosis not present

## 2019-03-04 DIAGNOSIS — R109 Unspecified abdominal pain: Secondary | ICD-10-CM | POA: Insufficient documentation

## 2019-03-04 DIAGNOSIS — N72 Inflammatory disease of cervix uteri: Secondary | ICD-10-CM

## 2019-03-04 DIAGNOSIS — Z79899 Other long term (current) drug therapy: Secondary | ICD-10-CM | POA: Insufficient documentation

## 2019-03-04 DIAGNOSIS — N760 Acute vaginitis: Secondary | ICD-10-CM | POA: Insufficient documentation

## 2019-03-04 DIAGNOSIS — B9689 Other specified bacterial agents as the cause of diseases classified elsewhere: Secondary | ICD-10-CM

## 2019-03-04 DIAGNOSIS — N83201 Unspecified ovarian cyst, right side: Secondary | ICD-10-CM

## 2019-03-04 LAB — COMPREHENSIVE METABOLIC PANEL
ALT: 13 U/L (ref 0–44)
AST: 20 U/L (ref 15–41)
Albumin: 4.4 g/dL (ref 3.5–5.0)
Alkaline Phosphatase: 61 U/L (ref 38–126)
Anion gap: 8 (ref 5–15)
BUN: 9 mg/dL (ref 6–20)
CO2: 24 mmol/L (ref 22–32)
Calcium: 9.1 mg/dL (ref 8.9–10.3)
Chloride: 104 mmol/L (ref 98–111)
Creatinine, Ser: 0.71 mg/dL (ref 0.44–1.00)
GFR calc Af Amer: >60 mL/min (ref >60)
GFR calc non Af Amer: >60 mL/min (ref >60)
Glucose, Bld: 87 mg/dL (ref 70–99)
Potassium: 3.8 mmol/L (ref 3.5–5.1)
Sodium: 136 mmol/L (ref 135–145)
Total Bilirubin: 0.4 mg/dL (ref 0.3–1.2)
Total Protein: 7.8 g/dL (ref 6.5–8.1)

## 2019-03-04 LAB — URINALYSIS, ROUTINE W REFLEX MICROSCOPIC
Bilirubin Urine: NEGATIVE
Glucose, UA: NEGATIVE mg/dL
Hgb urine dipstick: NEGATIVE
Ketones, ur: NEGATIVE mg/dL
Nitrite: NEGATIVE
Protein, ur: NEGATIVE mg/dL
Specific Gravity, Urine: 1.002 — ABNORMAL LOW (ref 1.005–1.030)
pH: 6 (ref 5.0–8.0)

## 2019-03-04 LAB — CBC WITH DIFFERENTIAL/PLATELET
Abs Immature Granulocytes: 0.02 10*3/uL (ref 0.00–0.07)
Basophils Absolute: 0.1 10*3/uL (ref 0.0–0.1)
Basophils Relative: 1 %
Eosinophils Absolute: 0.2 10*3/uL (ref 0.0–0.5)
Eosinophils Relative: 4 %
HCT: 42.9 % (ref 36.0–46.0)
Hemoglobin: 13.3 g/dL (ref 12.0–15.0)
Immature Granulocytes: 0 %
Lymphocytes Relative: 39 %
Lymphs Abs: 2.4 10*3/uL (ref 0.7–4.0)
MCH: 27.4 pg (ref 26.0–34.0)
MCHC: 31 g/dL (ref 30.0–36.0)
MCV: 88.5 fL (ref 80.0–100.0)
Monocytes Absolute: 0.5 10*3/uL (ref 0.1–1.0)
Monocytes Relative: 8 %
Neutro Abs: 2.9 10*3/uL (ref 1.7–7.7)
Neutrophils Relative %: 48 %
Platelets: 302 10*3/uL (ref 150–400)
RBC: 4.85 MIL/uL (ref 3.87–5.11)
RDW: 12.9 % (ref 11.5–15.5)
WBC: 6.2 10*3/uL (ref 4.0–10.5)
nRBC: 0 % (ref 0.0–0.2)

## 2019-03-04 LAB — WET PREP, GENITAL
Sperm: NONE SEEN
Trich, Wet Prep: NONE SEEN
Yeast Wet Prep HPF POC: NONE SEEN

## 2019-03-04 LAB — LIPASE, BLOOD: Lipase: 32 U/L (ref 11–51)

## 2019-03-04 LAB — I-STAT BETA HCG BLOOD, ED (MC, WL, AP ONLY): I-stat hCG, quantitative: <5 m[IU]/mL (ref <5)

## 2019-03-04 MED ORDER — MORPHINE SULFATE (PF) 4 MG/ML IV SOLN
4.0000 mg | Freq: Once | INTRAVENOUS | Status: AC
  Administered 2019-03-04: 4 mg via INTRAVENOUS
  Filled 2019-03-04: qty 1

## 2019-03-04 MED ORDER — IOHEXOL 300 MG/ML  SOLN
100.0000 mL | Freq: Once | INTRAMUSCULAR | Status: AC | PRN
  Administered 2019-03-04: 100 mL via INTRAVENOUS

## 2019-03-04 MED ORDER — ONDANSETRON HCL 4 MG/2ML IJ SOLN
4.0000 mg | Freq: Once | INTRAMUSCULAR | Status: AC
  Administered 2019-03-04: 4 mg via INTRAVENOUS
  Filled 2019-03-04: qty 2

## 2019-03-04 NOTE — ED Notes (Signed)
IV team at bedside 

## 2019-03-04 NOTE — ED Notes (Signed)
Patient transported to CT 

## 2019-03-04 NOTE — ED Triage Notes (Signed)
Pt c/o abd pains x 5 days. Thinks her IUD is misplaced. Reports pains worse with certain movements or bending. Also c/o lower back and side pain laughing.

## 2019-03-04 NOTE — ED Notes (Signed)
Pt ambulated to the restroom, unable to provide a sample at this time.

## 2019-03-05 LAB — GC/CHLAMYDIA PROBE AMP (~~LOC~~) NOT AT ARMC
Chlamydia: NEGATIVE
Neisseria Gonorrhea: NEGATIVE

## 2019-03-05 MED ORDER — HYDROCODONE-ACETAMINOPHEN 5-325 MG PO TABS
1.0000 | ORAL_TABLET | Freq: Once | ORAL | Status: AC
  Administered 2019-03-05: 1 via ORAL
  Filled 2019-03-05: qty 1

## 2019-03-05 MED ORDER — ONDANSETRON HCL 4 MG PO TABS: 4.0000 mg | ORAL_TABLET | Freq: Three times a day (TID) | ORAL | 0 refills | Status: DC | PRN

## 2019-03-05 MED ORDER — METRONIDAZOLE 500 MG PO TABS: 500.0000 mg | ORAL_TABLET | Freq: Two times a day (BID) | ORAL | 0 refills | Status: DC

## 2019-03-05 MED ORDER — AZITHROMYCIN 250 MG PO TABS
1000.0000 mg | ORAL_TABLET | Freq: Once | ORAL | Status: AC
  Administered 2019-03-05: 01:00:00 1000 mg via ORAL
  Filled 2019-03-05: qty 4

## 2019-03-05 MED ORDER — DOXYCYCLINE HYCLATE 100 MG PO CAPS: 100.0000 mg | ORAL_CAPSULE | Freq: Two times a day (BID) | ORAL | 0 refills | Status: DC

## 2019-03-05 MED ORDER — METRONIDAZOLE 500 MG PO TABS
500.0000 mg | ORAL_TABLET | Freq: Once | ORAL | Status: AC
  Administered 2019-03-05: 500 mg via ORAL
  Filled 2019-03-05: qty 1

## 2019-03-05 MED ORDER — STERILE WATER FOR INJECTION IJ SOLN
INTRAMUSCULAR | Status: AC
  Administered 2019-03-05: 01:00:00 0.9 mL
  Filled 2019-03-05: qty 10

## 2019-03-05 MED ORDER — CEFTRIAXONE SODIUM 250 MG IJ SOLR
250.0000 mg | Freq: Once | INTRAMUSCULAR | Status: AC
  Administered 2019-03-05: 01:00:00 250 mg via INTRAMUSCULAR
  Filled 2019-03-05: qty 250

## 2019-03-05 NOTE — ED Provider Notes (Signed)
[facility] DEPT Provider Note   CSN: [id number] Arrival date & time: 03/04/19  1722     History   Chief Complaint Chief Complaint  Patient presents with   Abdominal Pain    HPI Brandy Howard is a 23 y.o. female presenting for evaluation of abdominal pain.  Patient states over the past month, she has been having intermittent cramps and spotting.  Yesterday, she started to develop abdominal pain which is been constant since.  Pain is mostly in her lower abdomen, worse when she bends forward or with certain movements.  She is concerned that her IUD is out of place.  She reports associated nausea, but no vomiting.  She denies fevers, chills, chest pain, shortness of breath, cough, upper abdominal pain, abnormal bowel movements.  Patient states she is only urinating about 3 times a day, but denies dysuria or hematuria.  She denies a history of abdominal problems.  Patient's IUD was placed in July 2019, she has not followed up with OB/GYN regarding this.  She is sexually active with one female partner and states that they always use condoms.  She reports vaginal discharge which is worsened in the past several days.  Patient states she takes no medications daily.     HPI  Past Medical History:  Diagnosis Date   Anemia    Anxiety    Asthma    Depression    doing ok   Eczema    Genital herpes    Seasonal allergies     Patient Active Problem List   Diagnosis Date Noted   Abnormal uterine bleeding (AUB) 05/20/2017   Herpes infection 07/03/2014   Anemia 06/30/2014   Symptomatic anemia 06/30/2014    Past Surgical History:  Procedure Laterality Date   NO PAST SURGERIES       OB History    Gravida  0   Para  0   Term  0   Preterm  0   AB  0   Living  0     SAB  0   TAB  0   Ectopic  0   Multiple  0   Live Births               Home Medications    Prior to Admission medications   Medication Sig Start Date  End Date Taking? Authorizing Provider  acetaminophen (TYLENOL) 325 MG tablet Take 650 mg by mouth every 6 (six) hours as needed for mild pain or headache.   Yes [provider]  diphenhydrAMINE (BENADRYL) 25 mg capsule Take 25 mg by mouth 2 (two) times daily as needed for allergies.   Yes [provider]  ibuprofen (ADVIL,MOTRIN) 200 MG tablet Take 400 mg by mouth every 6 (six) hours as needed for headache or moderate pain.   Yes [provider]  levonorgestrel (MIRENA) 20 MCG/24HR IUD 1 each by Intrauterine route once.   Yes [provider]  benzonatate (TESSALON) 100 MG capsule Take 1 capsule (100 mg total) by mouth every 8 (eight) hours. Patient not taking: Reported on 03/04/2019 12/05/18   Petrucelli, Glynda Jaeger, PA-C  doxycycline (VIBRAMYCIN) 100 MG capsule Take 1 capsule (100 mg total) by mouth 2 (two) times daily. 03/05/19   Arilynn Blakeney, PA-C  fluticasone (FLONASE) 50 MCG/ACT nasal spray Place 1 spray into both nostrils daily. Patient not taking: Reported on 03/04/2019 12/05/18   Petrucelli, Aldona Bar R, PA-C  metroNIDAZOLE (FLAGYL) 500 MG tablet Take 1 tablet (500  mg total) by mouth 2 (two) times daily. 03/05/19   Curtina Grills, PA-C  naproxen (NAPROSYN) 500 MG tablet Take 1 tablet (500 mg total) by mouth 2 (two) times daily. Patient not taking: Reported on 03/04/2019 12/05/18   Petrucelli, Samantha R, PA-C  ondansetron (ZOFRAN) 4 MG tablet Take 1 tablet (4 mg total) by mouth every 8 (eight) hours as needed. 03/05/19   Franchot Heidelberg, PA-C    Family History Family History  Adopted: Yes  Family history unknown: Yes    Social History Social History   Tobacco Use   Smoking status: Former Smoker    Types: Cigars   Smokeless tobacco: Never Used   Tobacco comment: April 2017  Substance Use Topics   Alcohol use: Yes    Comment: Social    Drug use: No     Allergies   Dust mite extract and Latex   Review of Systems Review of Systems   Gastrointestinal: Positive for abdominal pain and nausea.  Genitourinary: Positive for decreased urine volume and vaginal discharge.  All other systems reviewed and are negative.    Physical Exam Updated Vital Signs BP 123/84 (BP Location: Left Arm)    Pulse 75    Temp 98.8 F (37.1 C) (Oral)    Resp 17    SpO2 100%   Physical Exam Vitals signs and nursing note reviewed. Exam conducted with a chaperone present.  Constitutional:      General: She is not in acute distress.    Appearance: She is well-developed.     Comments: Appears uncomfortable due to pain, nontoxic in appearance.   HENT:     Head: Normocephalic and atraumatic.  Eyes:     Conjunctiva/sclera: Conjunctivae normal.     Pupils: Pupils are equal, round, and reactive to light.  Neck:     Musculoskeletal: Normal range of motion and neck supple.  Cardiovascular:     Rate and Rhythm: Normal rate and regular rhythm.     Pulses: Normal pulses.  Pulmonary:     Effort: Pulmonary effort is normal. No respiratory distress.     Breath sounds: Normal breath sounds. No wheezing.  Abdominal:     General: Bowel sounds are normal. There is no distension.     Palpations: Abdomen is soft.     Tenderness: There is generalized abdominal tenderness.     Comments: Generalized ttp of the abd, worse in the lower abd.   Genitourinary:    Exam position: Supine.     Cervix: Cervical motion tenderness and discharge present.     Adnexa:        Left: Tenderness present.      Comments: Yellowish clumpy discharge from the cervix. IUD strings noted. +CMT and L sided adnexal tenderness.  Musculoskeletal: Normal range of motion.  Skin:    General: Skin is warm and dry.  Neurological:     Mental Status: She is alert and oriented to person, place, and time.      ED Treatments / Results  Labs (all labs ordered are listed, but only abnormal results are displayed) Labs Reviewed  WET PREP, GENITAL - Abnormal; Notable for the following  components:      Result Value   Clue Cells Wet Prep HPF POC PRESENT (*)    WBC, Wet Prep HPF POC MODERATE (*)    All other components within normal limits  URINALYSIS, ROUTINE W REFLEX MICROSCOPIC - Abnormal; Notable for the following components:   Color, Urine STRAW (*)  Specific Gravity, Urine 1.002 (*)    Leukocytes,Ua TRACE (*)    Bacteria, UA MANY (*)    All other components within normal limits  LIPASE, BLOOD  COMPREHENSIVE METABOLIC PANEL  CBC WITH DIFFERENTIAL/PLATELET  I-STAT BETA HCG BLOOD, ED ([facility], [facility], [facility] ONLY)  GC/CHLAMYDIA PROBE AMP ([place]) NOT AT [facility]    EKG    Radiology US Transvaginal Non-ob  Result Date: 03/04/2019 CLINICAL DATA:  Pelvic pain EXAM: TRANSABDOMINAL AND TRANSVAGINAL ULTRASOUND OF PELVIS DOPPLER ULTRASOUND OF OVARIES TECHNIQUE: Both transabdominal and transvaginal ultrasound examinations of the pelvis were performed. Transabdominal technique was performed for global imaging of the pelvis including uterus, ovaries, adnexal regions, and pelvic cul-de-sac. It was necessary to proceed with endovaginal exam following the transabdominal exam to visualize the uterus endometrium ovaries. Color and duplex Doppler ultrasound was utilized to evaluate blood flow to the ovaries. COMPARISON:  Ultrasound 06/29/2014 FINDINGS: Uterus Measurements: 5.9 x 3.3 x 4.6 cm = volume: 46.6 mL. No fibroids or other mass visualized. Endometrium Thickness: 10 mm.  IUD within the uterus Right ovary Measurements: 3.5 x 2.3 x 3.1 cm = volume: 13.6 mL. 2.5 cm cyst or dominant follicle. Left ovary Measurements: 3.6 x 1.7 x 2.2 cm = volume: 7.1 mL. Normal appearance/no adnexal mass. Pulsed Doppler evaluation of both ovaries demonstrates normal low-resistance arterial and venous waveforms. Other findings No abnormal free fluid. IMPRESSION: 1. Negative for ovarian torsion. 2. IUD within the uterus, appears grossly appropriate in position by sonography 3. Otherwise negative pelvic ultrasound  Electronically Signed   By: Donavan Foil M.D.   On: 03/04/2019 22:54   US Pelvis Complete  Result Date: 03/04/2019 CLINICAL DATA:  Pelvic pain EXAM: TRANSABDOMINAL AND TRANSVAGINAL ULTRASOUND OF PELVIS DOPPLER ULTRASOUND OF OVARIES TECHNIQUE: Both transabdominal and transvaginal ultrasound examinations of the pelvis were performed. Transabdominal technique was performed for global imaging of the pelvis including uterus, ovaries, adnexal regions, and pelvic cul-de-sac. It was necessary to proceed with endovaginal exam following the transabdominal exam to visualize the uterus endometrium ovaries. Color and duplex Doppler ultrasound was utilized to evaluate blood flow to the ovaries. COMPARISON:  Ultrasound 06/29/2014 FINDINGS: Uterus Measurements: 5.9 x 3.3 x 4.6 cm = volume: 46.6 mL. No fibroids or other mass visualized. Endometrium Thickness: 10 mm.  IUD within the uterus Right ovary Measurements: 3.5 x 2.3 x 3.1 cm = volume: 13.6 mL. 2.5 cm cyst or dominant follicle. Left ovary Measurements: 3.6 x 1.7 x 2.2 cm = volume: 7.1 mL. Normal appearance/no adnexal mass. Pulsed Doppler evaluation of both ovaries demonstrates normal low-resistance arterial and venous waveforms. Other findings No abnormal free fluid. IMPRESSION: 1. Negative for ovarian torsion. 2. IUD within the uterus, appears grossly appropriate in position by sonography 3. Otherwise negative pelvic ultrasound Electronically Signed   By: Donavan Foil M.D.   On: 03/04/2019 22:54   Ct Abdomen Pelvis W Contrast  Result Date: 03/05/2019 CLINICAL DATA:  Abdominal pain EXAM: CT ABDOMEN AND PELVIS WITH CONTRAST TECHNIQUE: Multidetector CT imaging of the abdomen and pelvis was performed using the standard protocol following bolus administration of intravenous contrast. CONTRAST:  143mL OMNIPAQUE IOHEXOL 300 MG/ML  SOLN COMPARISON:  Pelvic ultrasounds 03/04/2019 FINDINGS: LOWER CHEST: There is no basilar pleural or apical pericardial effusion.  HEPATOBILIARY: The hepatic contours and density are normal. There is no intra- or extrahepatic biliary dilatation. The gallbladder is normal. PANCREAS: The pancreatic parenchymal contours are normal and there is no ductal dilatation. There is no peripancreatic fluid collection. SPLEEN: Normal. ADRENALS/URINARY TRACT: --  Adrenal glands: Normal. --Right kidney/ureter: No hydronephrosis, nephroureterolithiasis, perinephric stranding or solid renal mass. --Left kidney/ureter: No hydronephrosis, nephroureterolithiasis, perinephric stranding or solid renal mass. --Urinary bladder: Normal for degree of distention STOMACH/BOWEL: --Stomach/Duodenum: There is no hiatal hernia or other gastric abnormality. The duodenal course and caliber are normal. --Small bowel: No dilatation or inflammation. --Colon: No focal abnormality. --Appendix: Normal. VASCULAR/LYMPHATIC: Normal course and caliber of the major abdominal vessels. No abdominal or pelvic lymphadenopathy. REPRODUCTIVE: There is a T-shaped contraceptive device within the uterus. Right ovarian cyst measures up to 2.5 cm. MUSCULOSKELETAL. No bony spinal canal stenosis or focal osseous abnormality. OTHER: None. IMPRESSION: 1. No acute abnormality of the abdomen or pelvis. 2. Right ovarian cyst measuring up to 2.5 cm, better characterized on earlier pelvic ultrasound. 3. Intrauterine contraceptive device in expected position. Electronically Signed   By: Ulyses Jarred M.D.   On: 03/05/2019 00:30   [place] Art/ven Flow Abd Pelv Doppler  Result Date: 03/04/2019 CLINICAL DATA:  Pelvic pain EXAM: TRANSABDOMINAL AND TRANSVAGINAL ULTRASOUND OF PELVIS DOPPLER ULTRASOUND OF OVARIES TECHNIQUE: Both transabdominal and transvaginal ultrasound examinations of the pelvis were performed. Transabdominal technique was performed for global imaging of the pelvis including uterus, ovaries, adnexal regions, and pelvic cul-de-sac. It was necessary to proceed with endovaginal exam following the  transabdominal exam to visualize the uterus endometrium ovaries. Color and duplex Doppler ultrasound was utilized to evaluate blood flow to the ovaries. COMPARISON:  Ultrasound 06/29/2014 FINDINGS: Uterus Measurements: 5.9 x 3.3 x 4.6 cm = volume: 46.6 mL. No fibroids or other mass visualized. Endometrium Thickness: 10 mm.  IUD within the uterus Right ovary Measurements: 3.5 x 2.3 x 3.1 cm = volume: 13.6 mL. 2.5 cm cyst or dominant follicle. Left ovary Measurements: 3.6 x 1.7 x 2.2 cm = volume: 7.1 mL. Normal appearance/no adnexal mass. Pulsed Doppler evaluation of both ovaries demonstrates normal low-resistance arterial and venous waveforms. Other findings No abnormal free fluid. IMPRESSION: 1. Negative for ovarian torsion. 2. IUD within the uterus, appears grossly appropriate in position by sonography 3. Otherwise negative pelvic ultrasound Electronically Signed   By: Donavan Foil M.D.   On: 03/04/2019 22:54    Procedures Procedures (including critical care time)  Medications Ordered in ED Medications  ondansetron (ZOFRAN) injection 4 mg (4 mg Intravenous Given 03/04/19 2108)  morphine 4 MG/ML injection 4 mg (4 mg Intravenous Given 03/04/19 2109)  iohexol (OMNIPAQUE) 300 MG/ML solution 100 mL (100 mLs Intravenous Contrast Given 03/04/19 2355)  azithromycin (ZITHROMAX) tablet 1,000 mg (1,000 mg Oral Given 03/05/19 0050)  cefTRIAXone (ROCEPHIN) injection 250 mg (250 mg Intramuscular Given 03/05/19 0052)  metroNIDAZOLE (FLAGYL) tablet 500 mg (500 mg Oral Given 03/05/19 0050)  sterile water (preservative free) injection (0.9 mLs  Given 03/05/19 0052)  HYDROcodone-acetaminophen (NORCO/VICODIN) 5-325 MG per tablet 1 tablet (1 tablet Oral Given 03/05/19 0050)     Initial Impression / Assessment and Plan / ED Course  I have reviewed the triage vital signs and the nursing notes.  Pertinent labs & imaging results that were available during my care of the patient were reviewed by me and considered in my  medical decision making (see chart for details).        Patient presenting for evaluation abdominal pain.  Physical exam shows patient who is afebrile electrocardiograph appears nontoxic, but does appear clinically to the pain.  Patient is concerned that her IUD is in place.  Based on her pelvic exam, I am concerned about TOA, PID, cervicitis.  However, she is  also having upper abdominal pain, consider intra-abdominal infection or GI etiology as well as GU.  However, will start with pelvic ultrasound.  Morphine and Zofran for symptom control.  Will obtain basic labs.  Labs reassuring, no leukocytosis.  Kidney and liver function reassuring.  UA negative for infection.  Wet prep positive for clue cells and white cells, gonorrhea and chlamydia sent.  Ultrasound pending.  Ultrasound shows 2.5 cm cyst on the right, but no other concerns such as TOA or abnormal placement of IUD.  Discussed findings with patient.  Discussed option of treatment for cervicitis and BV with close monitoring of symptoms worse CT for further evaluation.  Patient like to get the CT.  CT negative for acute findings other than ovarian cyst.  Discussed with patient.  Discussed treatment of BV with Flagyl, treatment for cervicitis with Doxy.  Zofran for nausea.  Encourage follow-up with PCP if symptoms are not improving.  At this time, patient appears safe for discharge.  Return precautions given.  Patient she understands and agrees to plan.   Final Clinical Impressions(s) / ED Diagnoses   Final diagnoses:  Acute abdominal pain  Cervicitis  BV (bacterial vaginosis)  Cyst of right ovary    ED Discharge Orders         Ordered    doxycycline (VIBRAMYCIN) 100 MG capsule  2 times daily     03/05/19 0037    metroNIDAZOLE (FLAGYL) 500 MG tablet  2 times daily     03/05/19 0037    ondansetron (ZOFRAN) 4 MG tablet  Every 8 hours PRN     03/05/19 0037           Franchot Heidelberg, PA-C 03/05/19 0128    Hayden Rasmussen,  MD 03/05/19 [phone]

## 2019-03-05 NOTE — Discharge Instructions (Addendum)
Take Flagyl as prescribed.  This is treating for bacterial vaginosis. Take doxycycline as prescribed.  This is treating for possible cervix infection. Use Zofran as needed for nausea or vomiting. You were tested for gonorrhea and chlamydia.  Results should be back in several days.  If positive, you do not need further treatment, but you will need to inform your partner.  If negative, you will not receive a phone call.  Either way, you may check online on MyChart. Use Tylenol and ibuprofen as needed for pain control. Follow-up with your primary care doctor if your symptoms are not improving. Return to the emergency room with any new, worsening, concerning symptoms.

## 2019-03-22 ENCOUNTER — Emergency Department (HOSPITAL_COMMUNITY): Payer: Managed Care, Other (non HMO)

## 2019-03-22 ENCOUNTER — Emergency Department (HOSPITAL_COMMUNITY)
Admission: EM | Admit: 2019-03-22 | Discharge: 2019-03-23 | Disposition: A | Discharge status: Home / Self Care | Payer: Managed Care, Other (non HMO) | Attending: Emergency Medicine | Admitting: Emergency Medicine

## 2019-03-22 ENCOUNTER — Encounter (HOSPITAL_COMMUNITY): Payer: Self-pay

## 2019-03-22 ENCOUNTER — Other Ambulatory Visit: Payer: Self-pay

## 2019-03-22 DIAGNOSIS — N899 Noninflammatory disorder of vagina, unspecified: Secondary | ICD-10-CM | POA: Insufficient documentation

## 2019-03-22 DIAGNOSIS — Z87891 Personal history of nicotine dependence: Secondary | ICD-10-CM | POA: Insufficient documentation

## 2019-03-22 DIAGNOSIS — J45909 Unspecified asthma, uncomplicated: Secondary | ICD-10-CM | POA: Insufficient documentation

## 2019-03-22 DIAGNOSIS — K5900 Constipation, unspecified: Secondary | ICD-10-CM | POA: Diagnosis not present

## 2019-03-22 DIAGNOSIS — R1031 Right lower quadrant pain: Secondary | ICD-10-CM | POA: Diagnosis present

## 2019-03-22 DIAGNOSIS — N939 Abnormal uterine and vaginal bleeding, unspecified: Secondary | ICD-10-CM

## 2019-03-22 LAB — CBC WITH DIFFERENTIAL/PLATELET
Abs Immature Granulocytes: 0.01 10*3/uL (ref 0.00–0.07)
Basophils Absolute: 0.1 10*3/uL (ref 0.0–0.1)
Basophils Relative: 1 %
Eosinophils Absolute: 0.2 10*3/uL (ref 0.0–0.5)
Eosinophils Relative: 3 %
HCT: 38.9 % (ref 36.0–46.0)
Hemoglobin: 12.5 g/dL (ref 12.0–15.0)
Immature Granulocytes: 0 %
Lymphocytes Relative: 49 %
Lymphs Abs: 2.5 10*3/uL (ref 0.7–4.0)
MCH: 28.2 pg (ref 26.0–34.0)
MCHC: 32.1 g/dL (ref 30.0–36.0)
MCV: 87.6 fL (ref 80.0–100.0)
Monocytes Absolute: 0.4 10*3/uL (ref 0.1–1.0)
Monocytes Relative: 7 %
Neutro Abs: 2.1 10*3/uL (ref 1.7–7.7)
Neutrophils Relative %: 40 %
Platelets: 355 10*3/uL (ref 150–400)
RBC: 4.44 MIL/uL (ref 3.87–5.11)
RDW: 13.2 % (ref 11.5–15.5)
WBC: 5.2 10*3/uL (ref 4.0–10.5)
nRBC: 0 % (ref 0.0–0.2)

## 2019-03-22 LAB — BASIC METABOLIC PANEL
Anion gap: 8 (ref 5–15)
BUN: 10 mg/dL (ref 6–20)
CO2: 24 mmol/L (ref 22–32)
Calcium: 8.5 mg/dL — ABNORMAL LOW (ref 8.9–10.3)
Chloride: 104 mmol/L (ref 98–111)
Creatinine, Ser: 0.77 mg/dL (ref 0.44–1.00)
GFR calc Af Amer: >60 mL/min (ref >60)
GFR calc non Af Amer: >60 mL/min (ref >60)
Glucose, Bld: 102 mg/dL — ABNORMAL HIGH (ref 70–99)
Potassium: 3.5 mmol/L (ref 3.5–5.1)
Sodium: 136 mmol/L (ref 135–145)

## 2019-03-22 LAB — URINALYSIS, ROUTINE W REFLEX MICROSCOPIC
Bilirubin Urine: NEGATIVE
Glucose, UA: NEGATIVE mg/dL
Ketones, ur: NEGATIVE mg/dL
Leukocytes,Ua: NEGATIVE
Nitrite: NEGATIVE
Protein, ur: NEGATIVE mg/dL
Specific Gravity, Urine: 1.005 (ref 1.005–1.030)
pH: 6 (ref 5.0–8.0)

## 2019-03-22 LAB — I-STAT BETA HCG BLOOD, ED (MC, WL, AP ONLY): I-stat hCG, quantitative: <5 m[IU]/mL (ref <5)

## 2019-03-22 LAB — WET PREP, GENITAL
Clue Cells Wet Prep HPF POC: NONE SEEN
Sperm: NONE SEEN
Trich, Wet Prep: NONE SEEN
WBC, Wet Prep HPF POC: NONE SEEN
Yeast Wet Prep HPF POC: NONE SEEN

## 2019-03-22 MED ORDER — SODIUM CHLORIDE 0.9 % IV BOLUS
1000.0000 mL | Freq: Once | INTRAVENOUS | Status: AC
  Administered 2019-03-23: 1000 mL via INTRAVENOUS

## 2019-03-22 MED ORDER — ONDANSETRON HCL 4 MG/2ML IJ SOLN
4.0000 mg | Freq: Once | INTRAMUSCULAR | Status: AC
  Administered 2019-03-23: 4 mg via INTRAVENOUS
  Filled 2019-03-22: qty 2

## 2019-03-22 MED ORDER — KETOROLAC TROMETHAMINE 30 MG/ML IJ SOLN
30.0000 mg | Freq: Once | INTRAMUSCULAR | Status: AC
  Administered 2019-03-23: 30 mg via INTRAVENOUS
  Filled 2019-03-22: qty 1

## 2019-03-22 NOTE — ED Provider Notes (Signed)
[facility] DEPT Provider Note   CSN: [id number] Arrival date & time: 03/22/19  1614    History   Chief Complaint Chief Complaint  Patient presents with   Abdominal Pain   Vaginal Bleeding    HPI Brandy Howard is a 24 y.o. female.     Brandy Howard is a 23 y.o. female with a history of asthma, eczema, ovarian cyst, anxiety and depression, who presents to the emergency department for evaluation of right lower quadrant abdominal pain.  She reports she has been having this pain intermittently for about a month and has a known right-sided ovarian cyst.  Over the past 2 days she reports pain has become persistent and progressively worse.  She reports that she also started having some intermittent vaginal bleeding, she reports this is overall light, but she did pass 1 small blood clot.  She denies any associated vaginal discharge.  Patient was seen in the emergency department on 6/22 with intermittent abdominal cramps and spotting and was found to have a right-sided ovarian cyst at this time, she has not yet followed up with OB/GYN.  Patient has an IUD in place and is also concerned this could be malposition, during recent visit it appeared to be in the correct place.  IUD has been in place since July 2019 she has not taken anything for her pain prior to arrival.  She denies any dysuria or urinary frequency, no flank pain.  She denies nausea, vomiting or diarrhea.  Reports normal bowel movements.  No fevers.  No cough or shortness of breath.  No other aggravating or alleviating factors.     Past Medical History:  Diagnosis Date   Anemia    Anxiety    Asthma    Depression    doing ok   Eczema    Genital herpes    Seasonal allergies     Patient Active Problem List   Diagnosis Date Noted   Abnormal uterine bleeding (AUB) 05/20/2017   Herpes infection 07/03/2014   Anemia 06/30/2014   Symptomatic anemia 06/30/2014    Past Surgical  History:  Procedure Laterality Date   NO PAST SURGERIES       OB History    Gravida  0   Para  0   Term  0   Preterm  0   AB  0   Living  0     SAB  0   TAB  0   Ectopic  0   Multiple  0   Live Births               Home Medications    Prior to Admission medications   Medication Sig Start Date End Date Taking? Authorizing Provider  diphenhydrAMINE (BENADRYL) 25 mg capsule Take 25 mg by mouth 2 (two) times daily as needed for allergies.   Yes [provider]  levonorgestrel (MIRENA) 20 MCG/24HR IUD 1 each by Intrauterine route once.   Yes [provider]  ondansetron (ZOFRAN) 4 MG tablet Take 1 tablet (4 mg total) by mouth every 8 (eight) hours as needed. 03/05/19  Yes Caccavale, Sophia, PA-C  benzonatate (TESSALON) 100 MG capsule Take 1 capsule (100 mg total) by mouth every 8 (eight) hours. Patient not taking: Reported on 03/04/2019 12/05/18   Petrucelli, Glynda Jaeger, PA-C  doxycycline (VIBRAMYCIN) 100 MG capsule Take 1 capsule (100 mg total) by mouth 2 (two) times daily. Patient not taking: Reported on 03/22/2019 03/05/19   Caccavale,  Sophia, PA-C  fluticasone (FLONASE) 50 MCG/ACT nasal spray Place 1 spray into both nostrils daily. Patient not taking: Reported on 03/04/2019 12/05/18   Petrucelli, Glynda Jaeger, PA-C  metroNIDAZOLE (FLAGYL) 500 MG tablet Take 1 tablet (500 mg total) by mouth 2 (two) times daily. Patient not taking: Reported on 03/22/2019 03/05/19   Caccavale, Sophia, PA-C  naproxen (NAPROSYN) 500 MG tablet Take 1 tablet (500 mg total) by mouth 2 (two) times daily. Patient not taking: Reported on 03/04/2019 12/05/18   Petrucelli, Glynda Jaeger, PA-C    Family History Family History  Adopted: Yes  Problem Relation Age of Onset   Hypertension Father    Hypertension Mother     Social History Social History   Tobacco Use   Smoking status: Former Smoker    Types: Cigars   Smokeless tobacco: Never Used   Tobacco comment: April 2017    Substance Use Topics   Alcohol use: Yes    Comment: Social    Drug use: No     Allergies   Dust mite extract and Latex   Review of Systems Review of Systems  Constitutional: Negative for chills and fever.  HENT: Negative.   Respiratory: Negative for cough and shortness of breath.   Cardiovascular: Negative for chest pain.  Gastrointestinal: Positive for abdominal pain. Negative for constipation, diarrhea, nausea and vomiting.  Genitourinary: Positive for pelvic pain and vaginal bleeding. Negative for dysuria, flank pain, frequency, hematuria and vaginal discharge.  Musculoskeletal: Negative for back pain.  Skin: Negative for color change and rash.  Neurological: Negative for dizziness, weakness, light-headedness and headaches.     Physical Exam Updated Vital Signs BP 110/76 (BP Location: Left Arm)    Pulse 91    Temp 98.4 F (36.9 C) (Oral)    Resp 16    Ht 5\' 3"  (1.6 m)    Wt 60.3 kg    SpO2 99%    BMI 23.56 kg/m   Physical Exam Vitals signs and nursing note reviewed. Exam conducted with a chaperone present.  Constitutional:      General: She is not in acute distress.    Appearance: She is well-developed and normal weight. She is not diaphoretic.  HENT:     Head: Normocephalic and atraumatic.     Mouth/Throat:     Mouth: Mucous membranes are moist.     Pharynx: Oropharynx is clear.  Eyes:     General:        Right eye: No discharge.        Left eye: No discharge.     Pupils: Pupils are equal, round, and reactive to light.  Neck:     Musculoskeletal: Neck supple.  Cardiovascular:     Rate and Rhythm: Normal rate and regular rhythm.     Heart sounds: Normal heart sounds.  Pulmonary:     Effort: Pulmonary effort is normal. No respiratory distress.     Breath sounds: Normal breath sounds. No wheezing or rales.     Comments: Respirations equal and unlabored, patient able to speak in full sentences, lungs clear to auscultation bilaterally Abdominal:     General:  Bowel sounds are normal. There is no distension.     Palpations: Abdomen is soft. There is no mass.     Tenderness: There is abdominal tenderness in the right lower quadrant. There is no guarding.     Comments: Abdomen is soft and nondistended, bowel sounds present throughout, there is tenderness in the right lower quadrant and suprapubic  region without peritoneal signs.  All other quadrants nontender to palpation.  No CVA tenderness bilaterally.  Genitourinary:    Comments: Chaperone present during pelvic exam. No external genital lesions noted. Speculum exam reveals a very small amount of blood in the vaginal vault, no vaginal discharge.  Strings visible from the IUD, no cervical lesions or evidence of cervicitis. On bimanual exam there is no cervical motion tenderness but patient does have right adnexal tenderness without palpable mass.  No left adnexal tenderness. Musculoskeletal:        General: No deformity.  Skin:    General: Skin is warm and dry.     Capillary Refill: Capillary refill takes less than 2 seconds.  Neurological:     Mental Status: She is alert and oriented to person, place, and time.     Coordination: Coordination normal.     Comments: Speech is clear, able to follow commands Moves extremities without ataxia, coordination intact  Psychiatric:        Mood and Affect: Mood normal.        Behavior: Behavior normal.      ED Treatments / Results  Labs (all labs ordered are listed, but only abnormal results are displayed) Labs Reviewed  BASIC METABOLIC PANEL - Abnormal; Notable for the following components:      Result Value   Glucose, Bld 102 (*)    Calcium 8.5 (*)    All other components within normal limits  URINALYSIS, ROUTINE W REFLEX MICROSCOPIC - Abnormal; Notable for the following components:   Color, Urine STRAW (*)    Hgb urine dipstick SMALL (*)    Bacteria, UA RARE (*)    All other components within normal limits  WET PREP, GENITAL  CBC WITH  DIFFERENTIAL/PLATELET  RPR  HIV ANTIBODY (ROUTINE TESTING W REFLEX)  I-STAT BETA HCG BLOOD, ED ([facility], [facility], [facility] ONLY)  GC/CHLAMYDIA PROBE AMP ([place]) NOT AT [facility]    EKG None  Radiology US Transvaginal Non-ob  Result Date: 03/22/2019 CLINICAL DATA:  Right lower quadrant pain and vaginal bleeding with an IUD in place. EXAM: TRANSABDOMINAL AND TRANSVAGINAL ULTRASOUND OF PELVIS DOPPLER ULTRASOUND OF OVARIES TECHNIQUE: Both transabdominal and transvaginal ultrasound examinations of the pelvis were performed. Transabdominal technique was performed for global imaging of the pelvis including uterus, ovaries, adnexal regions, and pelvic cul-de-sac. It was necessary to proceed with endovaginal exam following the transabdominal exam to visualize the ovaries. Color and duplex Doppler ultrasound was utilized to evaluate blood flow to the ovaries. COMPARISON:  March 04, 2019 FINDINGS: Uterus Measurements: 5.8 x 3.2 x 3.9 cm = volume: 38 mL. There is a well-positioned IUD in place. Endometrium Thickness: 2.8 mm.  No focal abnormality visualized. Right ovary Measurements: 3.4 x 1.6 x 2.9 cm = volume: 8.3 mL. Normal appearance/no adnexal mass. Left ovary Measurements: 2.7 x 1.3 x 3 cm = volume: 5.4 mL. Normal appearance/no adnexal mass. Pulsed Doppler evaluation of both ovaries demonstrates normal low-resistance arterial and venous waveforms. Other findings No abnormal free fluid. IMPRESSION: 1. Normal study. 2. IUD in place that appears grossly well position. Electronically Signed   By: Constance Holster M.D.   On: 03/22/2019 22:41   US Pelvis Complete  Result Date: 03/22/2019 CLINICAL DATA:  Right lower quadrant pain and vaginal bleeding with an IUD in place. EXAM: TRANSABDOMINAL AND TRANSVAGINAL ULTRASOUND OF PELVIS DOPPLER ULTRASOUND OF OVARIES TECHNIQUE: Both transabdominal and transvaginal ultrasound examinations of the pelvis were performed. Transabdominal technique was performed for global imaging of the  pelvis including uterus, ovaries, adnexal regions, and pelvic cul-de-sac. It was necessary to proceed with endovaginal exam following the transabdominal exam to visualize the ovaries. Color and duplex Doppler ultrasound was utilized to evaluate blood flow to the ovaries. COMPARISON:  March 04, 2019 FINDINGS: Uterus Measurements: 5.8 x 3.2 x 3.9 cm = volume: 38 mL. There is a well-positioned IUD in place. Endometrium Thickness: 2.8 mm.  No focal abnormality visualized. Right ovary Measurements: 3.4 x 1.6 x 2.9 cm = volume: 8.3 mL. Normal appearance/no adnexal mass. Left ovary Measurements: 2.7 x 1.3 x 3 cm = volume: 5.4 mL. Normal appearance/no adnexal mass. Pulsed Doppler evaluation of both ovaries demonstrates normal low-resistance arterial and venous waveforms. Other findings No abnormal free fluid. IMPRESSION: 1. Normal study. 2. IUD in place that appears grossly well position. Electronically Signed   By: Constance Holster M.D.   On: 03/22/2019 22:41   Ct Abdomen Pelvis W Contrast  Result Date: 03/23/2019 CLINICAL DATA:  Right lower quadrant pain for 1 month. EXAM: CT ABDOMEN AND PELVIS WITH CONTRAST TECHNIQUE: Multidetector CT imaging of the abdomen and pelvis was performed using the standard protocol following bolus administration of intravenous contrast. CONTRAST:  133mL OMNIPAQUE IOHEXOL 300 MG/ML  SOLN COMPARISON:  Pelvic ultrasound yesterday. Abdominal CT 3 weeks ago 03/04/2019 FINDINGS: Lower chest: Lung bases are clear. Hepatobiliary: Borderline hepatic steatosis. No focal hepatic abnormality. Gallbladder minimally distended. No calcified gallstone or pericholecystic inflammation. No biliary dilatation. Pancreas: No ductal dilatation or inflammation. Spleen: Normal in size without focal abnormality. Adrenals/Urinary Tract: Normal adrenal glands. No hydronephrosis or perinephric edema. Homogeneous renal enhancement. Urinary bladder is physiologically distended without wall thickening. Stomach/Bowel:  Bowel evaluation limited in the absence of enteric contrast and paucity of intra-abdominal fat. No bowel obstruction, inflammation, or evident wall thickening. Normal appendix, image 46 series 5. Moderate stool burden in the colon. Slight fecalization of small bowel contents. Stomach is nondistended. Vascular/Lymphatic: Abdominal aorta and IVC are unremarkable. Patent portal vein. No adenopathy. Reproductive: Intrauterine device in the uterus. No adnexal mass. Other: No free air, free fluid, or intra-abdominal fluid collection. Musculoskeletal: There are no acute or suspicious osseous abnormalities. IMPRESSION: 1. No acute abnormality or explanation for right lower quadrant pain. Normal appendix. 2. Borderline hepatic steatosis. Electronically Signed   By: [name] Rake M.D.   On: 03/23/2019 01:42   [place] Art/ven Flow Abd Pelv Doppler  Result Date: 03/22/2019 CLINICAL DATA:  Right lower quadrant pain and vaginal bleeding with an IUD in place. EXAM: TRANSABDOMINAL AND TRANSVAGINAL ULTRASOUND OF PELVIS DOPPLER ULTRASOUND OF OVARIES TECHNIQUE: Both transabdominal and transvaginal ultrasound examinations of the pelvis were performed. Transabdominal technique was performed for global imaging of the pelvis including uterus, ovaries, adnexal regions, and pelvic cul-de-sac. It was necessary to proceed with endovaginal exam following the transabdominal exam to visualize the ovaries. Color and duplex Doppler ultrasound was utilized to evaluate blood flow to the ovaries. COMPARISON:  March 04, 2019 FINDINGS: Uterus Measurements: 5.8 x 3.2 x 3.9 cm = volume: 38 mL. There is a well-positioned IUD in place. Endometrium Thickness: 2.8 mm.  No focal abnormality visualized. Right ovary Measurements: 3.4 x 1.6 x 2.9 cm = volume: 8.3 mL. Normal appearance/no adnexal mass. Left ovary Measurements: 2.7 x 1.3 x 3 cm = volume: 5.4 mL. Normal appearance/no adnexal mass. Pulsed Doppler evaluation of both ovaries demonstrates normal  low-resistance arterial and venous waveforms. Other findings No abnormal free fluid. IMPRESSION: 1. Normal study. 2. IUD in place that appears grossly well position. Electronically  Signed   By: Constance Holster M.D.   On: 03/22/2019 22:41    Procedures Procedures (including critical care time)  Medications Ordered in ED Medications  ketorolac (TORADOL) 30 MG/ML injection 30 mg (30 mg Intravenous Given 03/23/19 0004)  sodium chloride 0.9 % bolus 1,000 mL (0 mLs Intravenous Stopped 03/23/19 0206)  ondansetron (ZOFRAN) injection 4 mg (4 mg Intravenous Given 03/23/19 0004)  iohexol (OMNIPAQUE) 300 MG/ML solution 100 mL (100 mLs Intravenous Contrast Given 03/23/19 0052)  sodium chloride (PF) 0.9 % injection (  Given by Other 03/23/19 0052)     Initial Impression / Assessment and Plan / ED Course  I have reviewed the triage vital signs and the nursing notes.  Pertinent labs & imaging results that were available during my care of the patient were reviewed by me and considered in my medical decision making (see chart for details).  Patient presents with right lower quadrant abdominal pain and 2 days of vaginal bleeding.  Known history of right ovarian cyst.  No associated nausea, vomiting, fevers, urinary symptoms, no abnormal bowel movements.  Light vaginal bleeding reported.  Vitals normal on arrival.  Pelvic exam with small amount of bleeding, no cervical motion tenderness or vaginal discharge to suggest PID, patient does have tenderness in the right lower quadrant over.  Will get labs and pelvic ultrasound given known history of right ovarian cyst to rule out torsion.  STD testing sent as well.  Labs overall reassuring with no leukocytosis, normal hemoglobin, no acute electrolyte derangements.  Negative pregnancy.  Urinalysis without signs of infection.  Ultrasound shows no evidence of ovarian cysts bilaterally, no evidence of torsion, and that IUD appears to be well positioned.  Despite reassuring  ultrasound patient is still having discomfort in this area, discussed with patient that she could have had a ruptured cyst causing this pain but will proceed with CT to rule out appendicitis or other intra-abdominal cause for her symptoms.  CT abdomen pelvis overall reassuring, shows a moderate amount of stool retained within the colon to suggest some degree of constipation but normal appendix and no other acute findings.  Discussed these results with patient.  We will have her begin using MiraLAX, ibuprofen and Tylenol as needed for pain and will have her follow-up with OB/GYN.  Return precautions discussed.  Patient expresses understanding and agreement with plan.  Discharged home in good condition.  Final Clinical Impressions(s) / ED Diagnoses   Final diagnoses:  Right lower quadrant abdominal pain  Vaginal bleeding  Constipation, unspecified constipation type    ED Discharge Orders    None       Jacqlyn Larsen, [place] 03/28/19 Faulk, [place], DO 04/02/19 1116

## 2019-03-22 NOTE — ED Triage Notes (Addendum)
Patient c/o right lower abdominal pain x 1 month. Patient states she has an IUD and a known ovarian cyst. Breast swelling x 2 weeks.  Patient states that she begun having vaginal bleeding x 2 days that has gotten progressively worse. patient states she had one blood clot.

## 2019-03-23 ENCOUNTER — Emergency Department (HOSPITAL_COMMUNITY): Payer: Managed Care, Other (non HMO)

## 2019-03-23 ENCOUNTER — Encounter (HOSPITAL_COMMUNITY): Payer: Self-pay

## 2019-03-23 LAB — RPR: RPR Ser Ql: NONREACTIVE

## 2019-03-23 LAB — GC/CHLAMYDIA PROBE AMP (~~LOC~~) NOT AT ARMC
Chlamydia: NEGATIVE
Neisseria Gonorrhea: NEGATIVE

## 2019-03-23 LAB — HIV ANTIBODY (ROUTINE TESTING W REFLEX): HIV Screen 4th Generation wRfx: NONREACTIVE

## 2019-03-23 MED ORDER — IOHEXOL 300 MG/ML  SOLN
100.0000 mL | Freq: Once | INTRAMUSCULAR | Status: AC | PRN
  Administered 2019-03-23: 100 mL via INTRAVENOUS

## 2019-03-23 MED ORDER — SODIUM CHLORIDE (PF) 0.9 % IJ SOLN
INTRAMUSCULAR | Status: AC
  Administered 2019-03-23: 01:00:00
  Filled 2019-03-23: qty 50

## 2019-03-23 NOTE — Discharge Instructions (Addendum)
Your evaluation today is reassuring, ultrasound shows resolution of previous right ovarian cyst and labs overall look good.  No evidence of pelvic or urinary tract infection.  Your CT scan does not show any evidence of appendicitis it does show some constipation to help with this you can try increasing fiber in your diet or using daily MiraLAX to help with regular bowel movements.  Please use the phone number provided on your paperwork today to establish care with your primary care doctor.  You can also follow-up at the women's clinic if your vaginal bleeding continues, or you are having any issues with your IUD, they can also help provide regular gynecologic care as needed.  Return for worsening pain, fevers, persistent vomiting, heavy vaginal bleeding or any other new or concerning symptoms.

## 2019-04-11 ENCOUNTER — Other Ambulatory Visit: Payer: Self-pay | Admitting: Obstetrics and Gynecology

## 2019-04-11 DIAGNOSIS — N644 Mastodynia: Secondary | ICD-10-CM

## 2019-04-19 ENCOUNTER — Ambulatory Visit
Admission: RE | Admit: 2019-04-19 | Discharge: 2019-04-19 | Disposition: A | Discharge status: Home / Self Care | Payer: Managed Care, Other (non HMO) | Source: Ambulatory Visit | Attending: Obstetrics and Gynecology | Admitting: Obstetrics and Gynecology

## 2019-04-19 ENCOUNTER — Other Ambulatory Visit: Payer: Self-pay

## 2019-04-19 DIAGNOSIS — N644 Mastodynia: Secondary | ICD-10-CM

## 2019-06-14 ENCOUNTER — Other Ambulatory Visit: Payer: Self-pay

## 2019-06-14 ENCOUNTER — Encounter (HOSPITAL_COMMUNITY): Payer: Self-pay | Admitting: Emergency Medicine

## 2019-06-14 ENCOUNTER — Ambulatory Visit (HOSPITAL_COMMUNITY)
Admission: EM | Admit: 2019-06-14 | Discharge: 2019-06-14 | Disposition: A | Discharge status: Home / Self Care | Payer: 59 | Attending: Family Medicine | Admitting: Family Medicine

## 2019-06-14 DIAGNOSIS — N898 Other specified noninflammatory disorders of vagina: Secondary | ICD-10-CM

## 2019-06-14 DIAGNOSIS — B373 Candidiasis of vulva and vagina: Secondary | ICD-10-CM | POA: Insufficient documentation

## 2019-06-14 DIAGNOSIS — Z20828 Contact with and (suspected) exposure to other viral communicable diseases: Secondary | ICD-10-CM | POA: Insufficient documentation

## 2019-06-14 MED ORDER — VALACYCLOVIR HCL 1 G PO TABS: 1000.0000 mg | ORAL_TABLET | Freq: Three times a day (TID) | ORAL | 0 refills | Status: DC

## 2019-06-14 MED ORDER — AZITHROMYCIN 250 MG PO TABS
ORAL_TABLET | ORAL | Status: AC
  Filled 2019-06-14: qty 4

## 2019-06-14 MED ORDER — FLUCONAZOLE 150 MG PO TABS: ORAL_TABLET | ORAL | 0 refills | Status: DC

## 2019-06-14 MED ORDER — AZITHROMYCIN 250 MG PO TABS
1000.0000 mg | ORAL_TABLET | Freq: Once | ORAL | Status: AC
  Administered 2019-06-14: 1000 mg via ORAL

## 2019-06-14 MED ORDER — CEFTRIAXONE SODIUM 250 MG IJ SOLR
INTRAMUSCULAR | Status: AC
  Filled 2019-06-14: qty 250

## 2019-06-14 MED ORDER — CEFTRIAXONE SODIUM 250 MG IJ SOLR
250.0000 mg | Freq: Once | INTRAMUSCULAR | Status: AC
  Administered 2019-06-14: 250 mg via INTRAMUSCULAR

## 2019-06-14 NOTE — ED Triage Notes (Addendum)
Pt states she wants to be tested for stds, states she knows she has herpres but its "burning down there" different than a usual outbreak. Pt states a fever yesterday of 100, doesn't want to be tested for covid, doesn't want to be seen for that. States burning has been going on for a week.

## 2019-06-14 NOTE — Discharge Instructions (Addendum)
You have been given the following medications today for treatment of suspected gonorrhea and/or chlamydia:  cefTRIAXone (ROCEPHIN) injection 250 mg azithromycin (ZITHROMAX) tablet 1,000 mg  Even though we have treated you today, we have sent testing for sexually transmitted infections. We will notify you of any positive results once they are received. If required, we will prescribe any medications you might need.  Please refrain from all sexual activity for at least the next seven days.  If your Covid-19 test is positive, you will get a phone call from [facility] regarding your results. If your Covid-19 test is negative, you will NOT get a phone call from [facility] with your results. You may view your results on MyChart. If you do not have a MyChart account, sign up instructions are in your discharge papers.

## 2019-06-16 LAB — NOVEL CORONAVIRUS, NAA (HOSP ORDER, SEND-OUT TO REF LAB; TAT 18-24 HRS): SARS-CoV-2, NAA: NOT DETECTED

## 2019-06-18 LAB — CERVICOVAGINAL ANCILLARY ONLY
Bacterial Vaginitis (gardnerella): NEGATIVE
Candida Glabrata: NEGATIVE
Candida Vaginitis: POSITIVE — AB
Chlamydia: NEGATIVE
Neisseria Gonorrhea: NEGATIVE
Trichomonas: NEGATIVE

## 2019-06-19 NOTE — ED Provider Notes (Signed)
[place]   [id number] 06/14/19 Arrival Time: 1900  ASSESSMENT & PLAN:  1. Vaginal irritation   2. Vaginal discharge     Declines HIV/RPR testing. COVID-19 testing sent; she desires.   Meds ordered this encounter  Medications  . cefTRIAXone (ROCEPHIN) injection 250 mg  . azithromycin (ZITHROMAX) tablet 1,000 mg  . fluconazole (DIFLUCAN) 150 MG tablet    Sig: Take one tablet by mouth as a single dose. May repeat in 3 days if symptoms persist.    Dispense:  2 tablet    Refill:  0  . valACYclovir (VALTREX) 1000 MG tablet    Sig: Take 1 tablet (1,000 mg total) by mouth 3 (three) times daily.    Dispense:  21 tablet    Refill:  0   No signs of PID.   Discharge Instructions     You have been given the following medications today for treatment of suspected gonorrhea and/or chlamydia:  cefTRIAXone (ROCEPHIN) injection 250 mg azithromycin (ZITHROMAX) tablet 1,000 mg  Even though we have treated you today, we have sent testing for sexually transmitted infections. We will notify you of any positive results once they are received. If required, we will prescribe any medications you might need.  Please refrain from all sexual activity for at least the next seven days.  If your Covid-19 test is positive, you will get a phone call from [facility] regarding your results. If your Covid-19 test is negative, you will NOT get a phone call from [facility] with your results. You may view your results on MyChart. If you do not have a MyChart account, sign up instructions are in your discharge papers.      Pending: Labs Reviewed  CERVICOVAGINAL ANCILLARY ONLY - Abnormal; Notable for the following components:      Result Value   Candida Vaginitis Positive (*)    All other components within normal limits  NOVEL CORONAVIRUS, NAA (HOSP ORDER, SEND-OUT TO REF LAB; TAT 18-24 HRS)    Will notify of any positive results. Instructed to refrain from sexual activity for at least  seven days.  Reviewed expectations re: course of current medical issues. Questions answered. Outlined signs and symptoms indicating need for more acute intervention. Patient verbalized understanding. After Visit Summary given.   SUBJECTIVE:  Brandy Howard is a 23 y.o. female who presents with complaint of vaginal discharge and irritation. Onset gradual. First noticed over the past several days. Describes discharge as thick and white incolor. Some vaginal itching. Denies: urinary frequency, hematuria, urinary hesitancy, urinary retention. Questions chills last night; temperature between 99-100 degrees F. Afebrile. No abdominal or pelvic pain. Normal PO intake wihout n/v. No rashes or lesions. Reports that she is sexually active with single female partner. OTC treatment: none. History of genital herpes; questions relation to current symptoms. Requests Valtrex.  Patient's last menstrual period was 05/21/2019.  ROS: As per HPI. All other systems negative.   OBJECTIVE:  Vitals:   06/14/19 1935  BP: 126/80  Pulse: 90  Resp: 18  Temp: 98.3 F (36.8 C)  SpO2: 100%     General appearance: alert, cooperative, appears stated age and no distress Throat: lips, mucosa, and tongue normal; teeth and gums normal CV: RRR Lungs: CTAB Back: no CVA tenderness; FROM at waist Abdomen: soft, non-tender GU: deferred Skin: warm and dry Psychological: alert and cooperative; normal mood and affect.    Labs Reviewed  CERVICOVAGINAL ANCILLARY ONLY - Abnormal; Notable for the following components:  Result Value   Candida Vaginitis Positive (*)    All other components within normal limits  NOVEL CORONAVIRUS, NAA (HOSP ORDER, SEND-OUT TO REF LAB; TAT 18-24 HRS)    Allergies  Allergen Reactions  . Dust Mite Extract Itching  . Latex Swelling    Past Medical History:  Diagnosis Date  . Anemia   . Anxiety   . Asthma   . Depression    doing ok  . Eczema   . Genital herpes   .  Seasonal allergies    Family History  Adopted: Yes  Problem Relation Age of Onset  . Hypertension Father   . Hypertension Mother    Social History   Socioeconomic History  . Marital status: Married    Spouse name: Not on file  . Number of children: Not on file  . Years of education: Not on file  . Highest education level: Not on file  Occupational History  . Not on file  Social Needs  . Financial resource strain: Not on file  . Food insecurity    Worry: Not on file    Inability: Not on file  . Transportation needs    Medical: Not on file    Non-medical: Not on file  Tobacco Use  . Smoking status: Former Smoker    Types: Cigars  . Smokeless tobacco: Never Used  . Tobacco comment: April 2017  Substance and Sexual Activity  . Alcohol use: Yes    Comment: Social   . Drug use: No  . Sexual activity: Yes    Birth control/protection: None  Lifestyle  . Physical activity    Days per week: Not on file    Minutes per session: Not on file  . Stress: Not on file  Relationships  . Social Herbalist on phone: Not on file    Gets together: Not on file    Attends religious service: Not on file    Active member of club or organization: Not on file    Attends meetings of clubs or organizations: Not on file    Relationship status: Not on file  . Intimate partner violence    Fear of current or ex partner: Not on file    Emotionally abused: Not on file    Physically abused: Not on file    Forced sexual activity: Not on file  Other Topics Concern  . Not on file  Social History Narrative  . Not on file          Vanessa Kick, MD 06/19/19 [phone]

## 2019-08-23 IMAGING — US ARTERIAL AND VENOUS ULTRASOUND OF THE ABDOMEN PELVIS AND SCROTUM
1 series · 13 of 25 positions shown · non-contrast
Comparison: Ultrasound 06/29/2014

CLINICAL DATA: Pelvic pain

EXAM:
TRANSABDOMINAL AND TRANSVAGINAL ULTRASOUND OF PELVIS
DOPPLER ULTRASOUND OF OVARIES
TECHNIQUE: Both transabdominal and transvaginal ultrasound examinations of the
pelvis were performed. Transabdominal technique was performed for
global imaging of the pelvis including uterus, ovaries, adnexal
regions, and pelvic cul-de-sac.
It was necessary to proceed with endovaginal exam following the
transabdominal exam to visualize the uterus endometrium ovaries.
Color and duplex Doppler ultrasound was utilized to evaluate blood
flow to the ovaries.

[Series 1: arterial and venous ultrasound of the abdomen pelv · 13 of 195 slices shown]
[im 1/195]
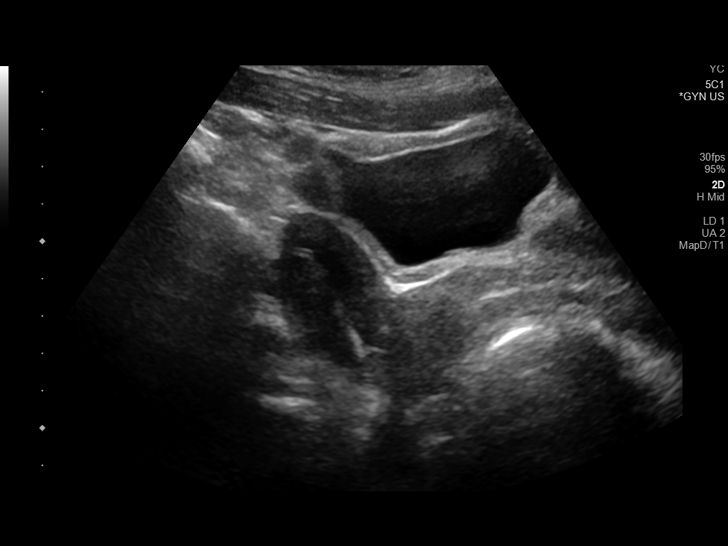
[im 17/195]
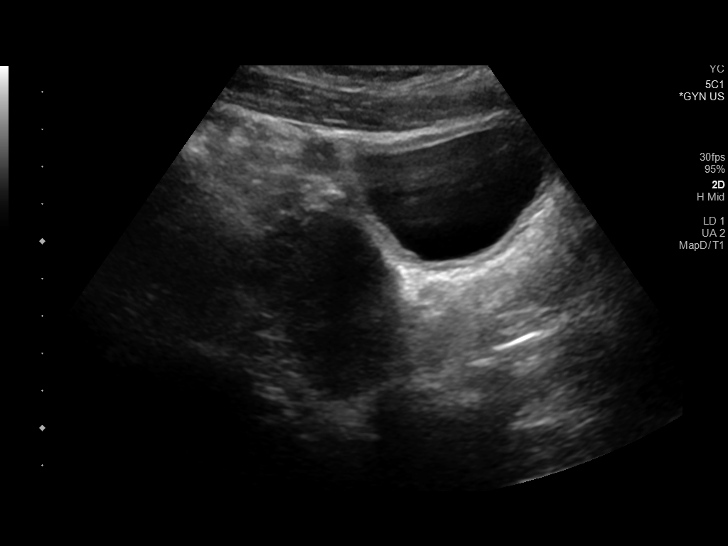
[im 33/195]
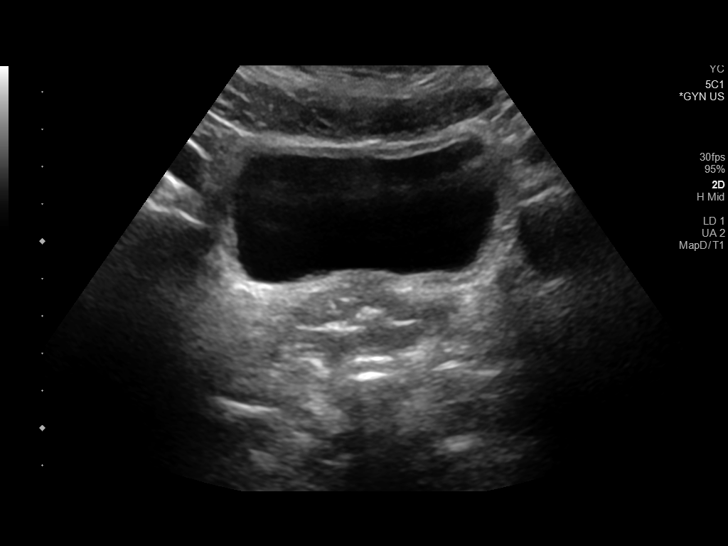
[im 49/195]
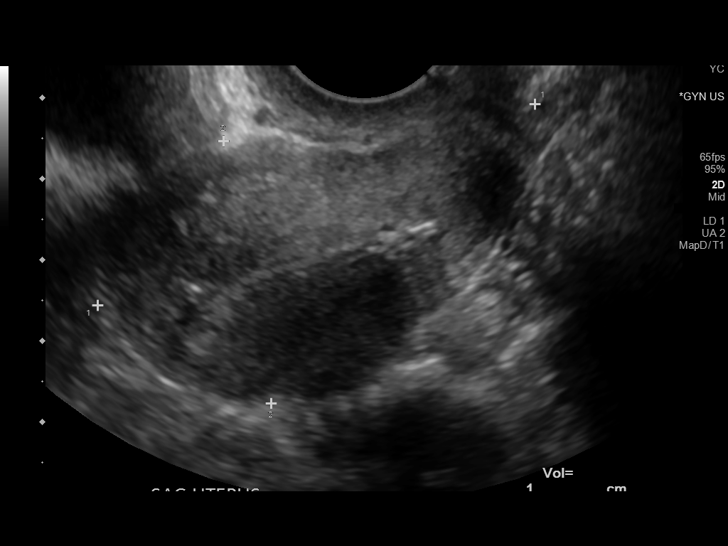
[im 65/195]
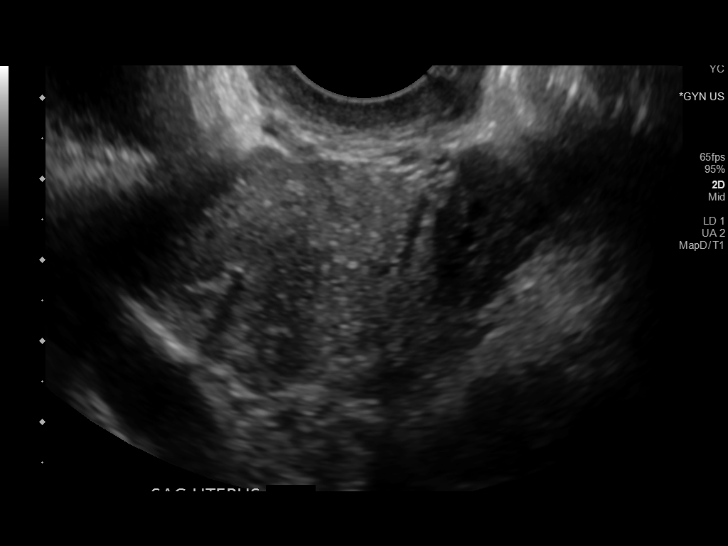
[im 81/195]
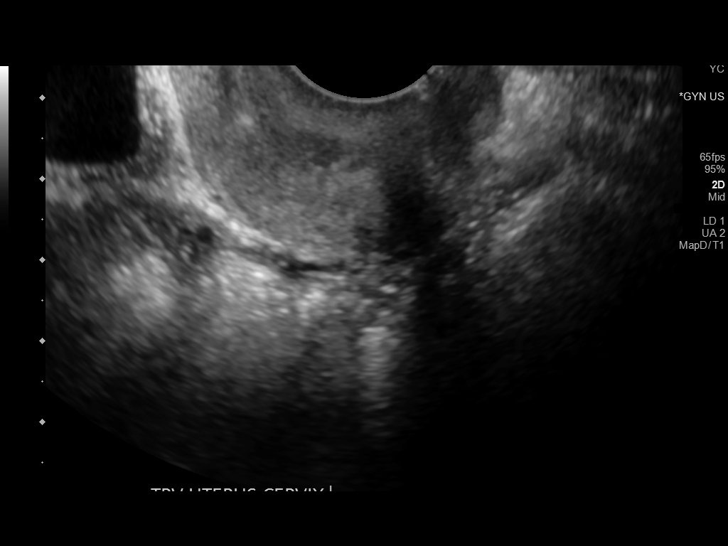
[im 98/195]
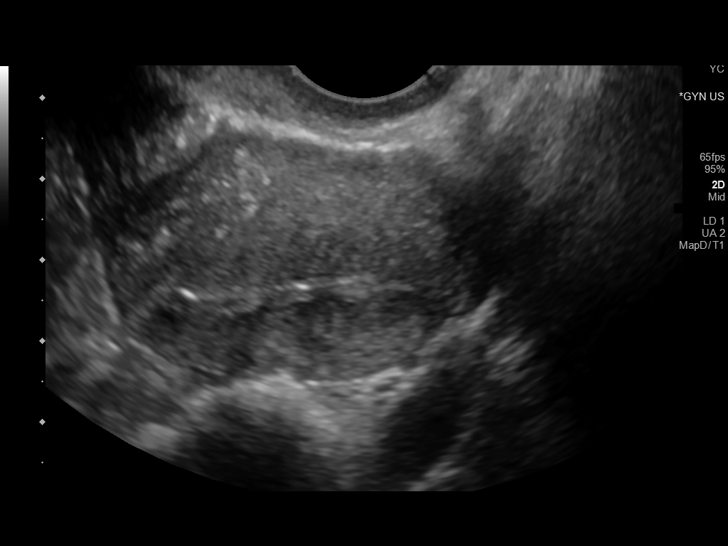
[im 114/195]
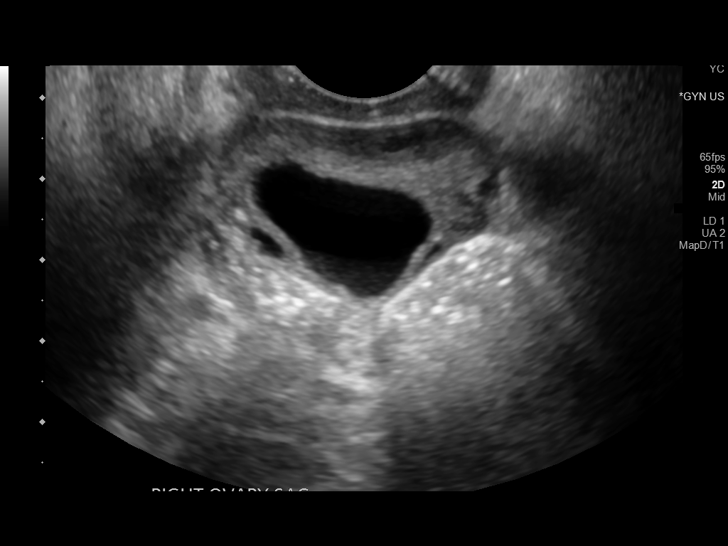
[im 130/195]
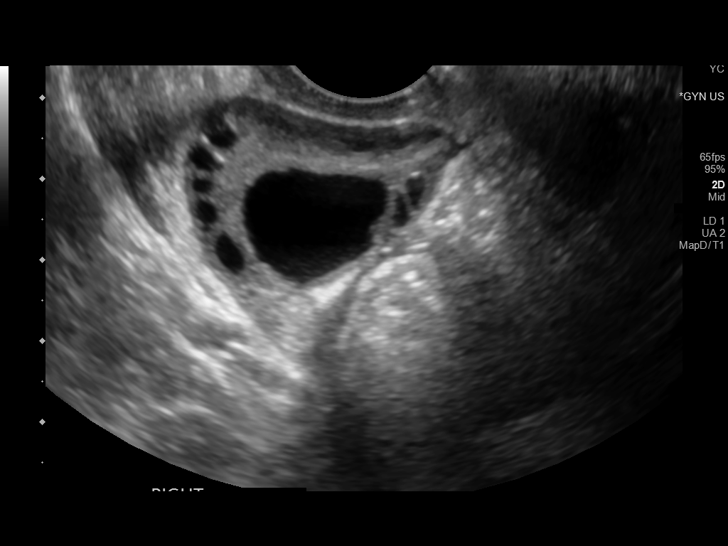
[im 146/195]
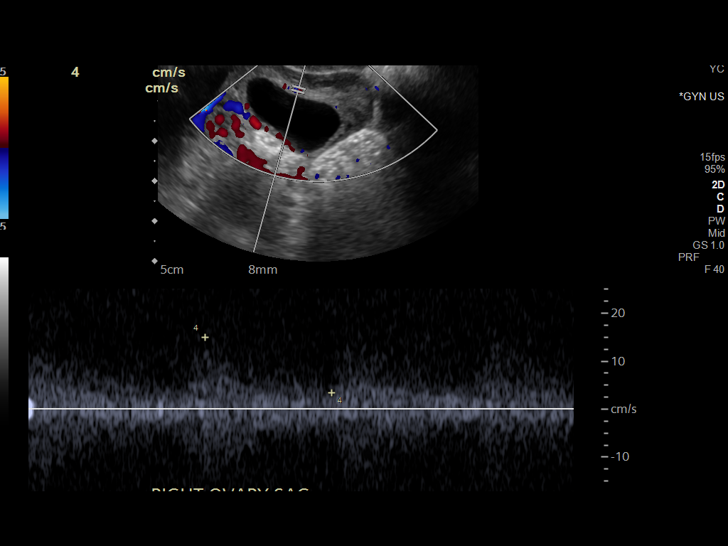
[im 162/195]
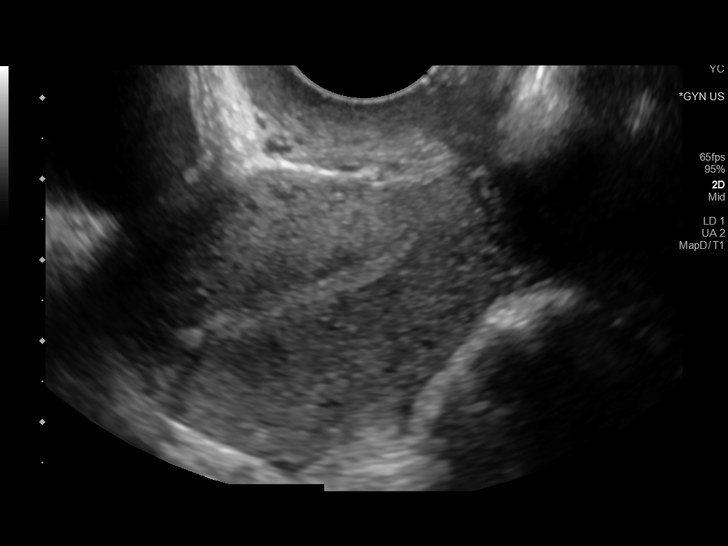
[im 178/195]
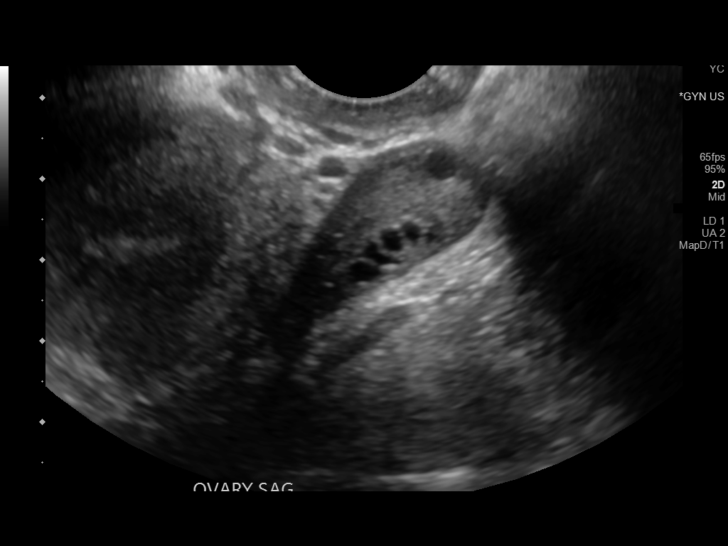
[im 195/195]
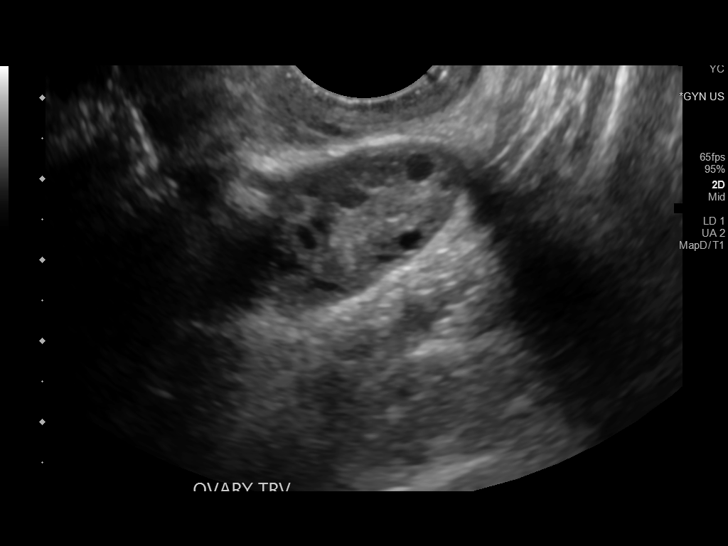

[13 of 25 positions shown; findings below may reference images not displayed]

FINDINGS: Uterus

Measurements: 5.9 x 3.3 x 4.6 cm = volume: 46.6 mL. No fibroids or
other mass visualized.

Endometrium

Thickness: 10 mm.  IUD within the uterus

Right ovary

Measurements: 3.5 x 2.3 x 3.1 cm = volume: 13.6 mL. 2.5 cm cyst or
dominant follicle.

Left ovary

Measurements: 3.6 x 1.7 x 2.2 cm = volume: 7.1 mL. Normal
appearance/no adnexal mass.

Pulsed Doppler evaluation of both ovaries demonstrates normal
low-resistance arterial and venous waveforms.

Other findings

No abnormal free fluid.
IMPRESSION: 1. Negative for ovarian torsion.
2. IUD within the uterus, appears grossly appropriate in position by
sonography
3. Otherwise negative pelvic ultrasound

## 2019-09-11 IMAGING — CT CT ABDOMEN AND PELVIS WITH CONTRAST
2 of 4 series · 16 of 46 positions shown, 18 images · IV contrast (OMNIPAQUE)
Comparison: Pelvic ultrasound yesterday. Abdominal CT 3 weeks ago
03/04/2019

CLINICAL DATA: Right lower quadrant pain for 1 month.

EXAM:
CT ABDOMEN AND PELVIS WITH CONTRAST
TECHNIQUE: Multidetector CT imaging of the abdomen and pelvis was performed
using the standard protocol following bolus administration of
intravenous contrast.
CONTRAST:  100mL OMNIPAQUE IOHEXOL 300 MG/ML  SOLN

[Series 2: axial st · axial · 0.55mm/px · z∈[-768,-422]mm · 13 of 77 slices shown, 15 images]
[im 4/77  soft-tissue]
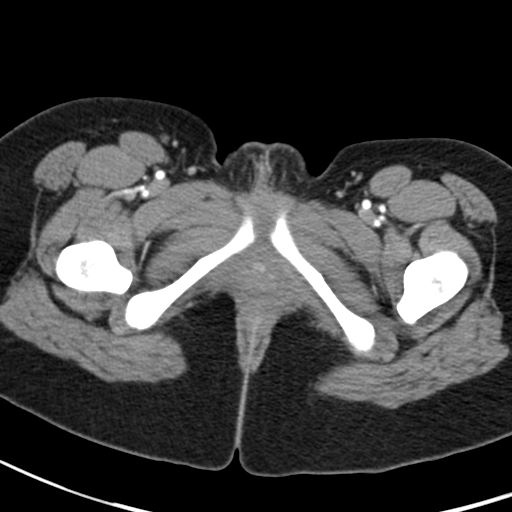
[im 4/77  bone]
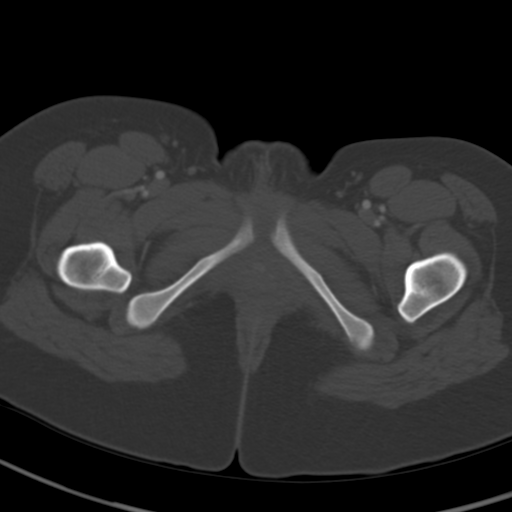
[im 11/77  soft-tissue]
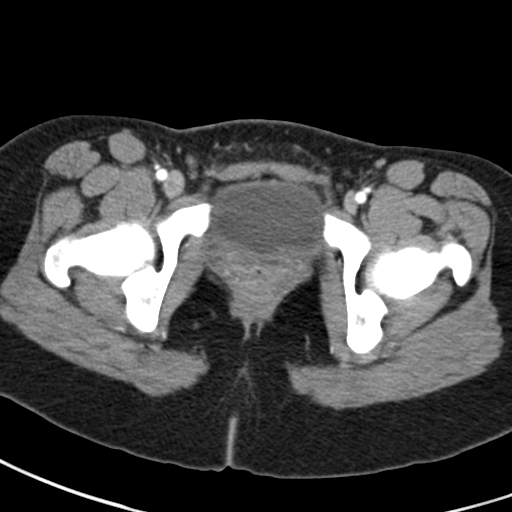
[im 18/77  soft-tissue]
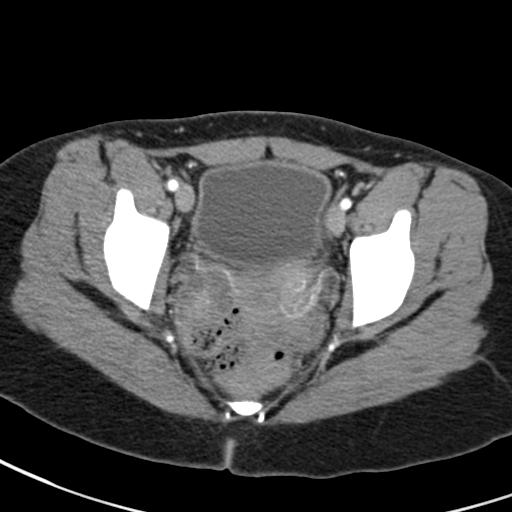
[im 21/77  soft-tissue]
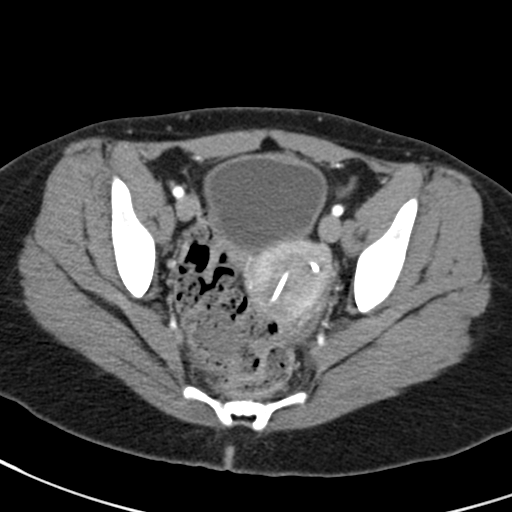
[im 28/77  soft-tissue]
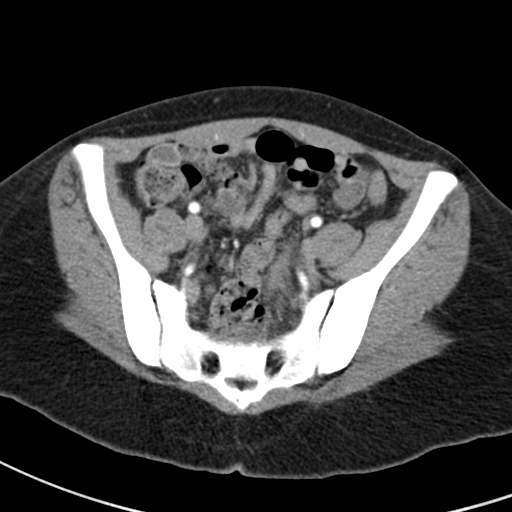
[im 32/77  soft-tissue]
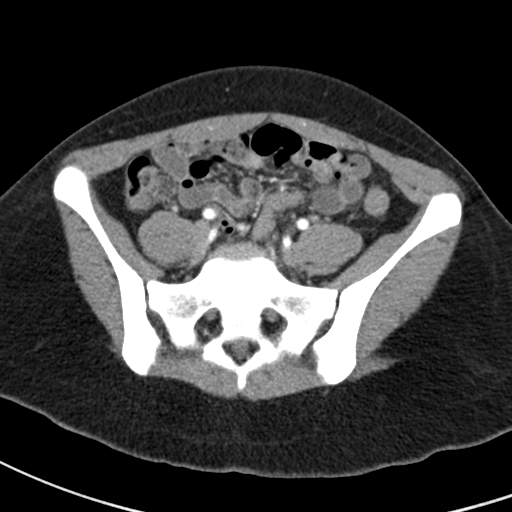
[im 39/77  soft-tissue]
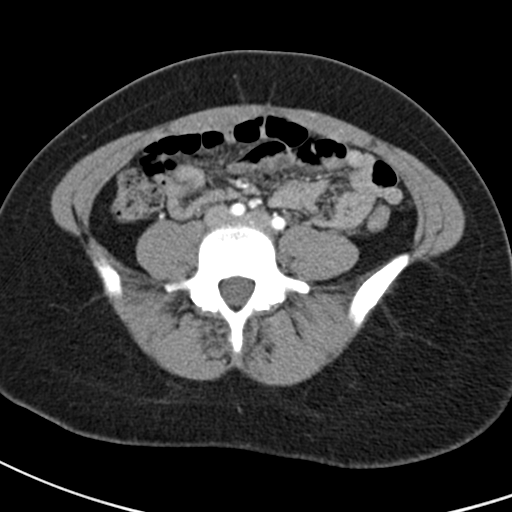
[im 45/77  soft-tissue]
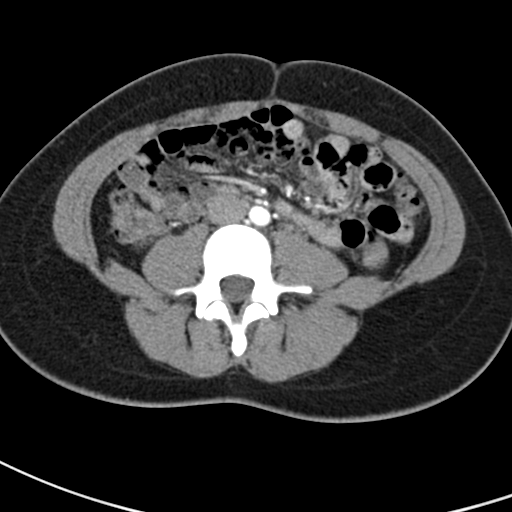
[im 49/77  soft-tissue]
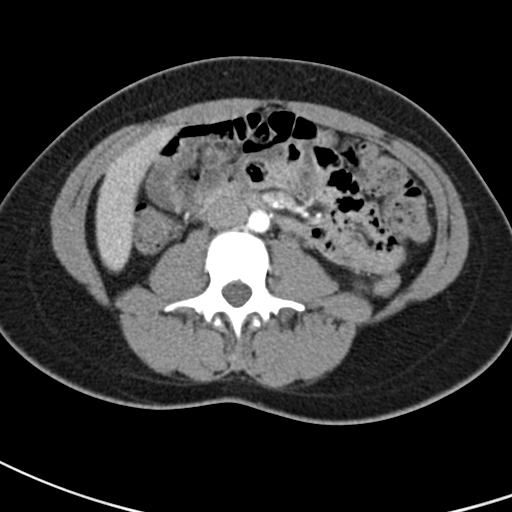
[im 49/77  bone]
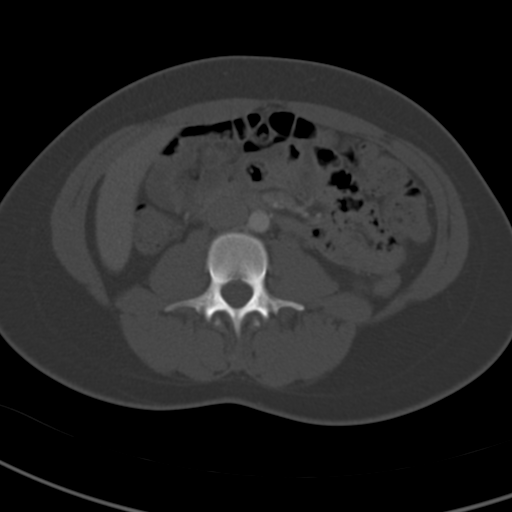
[im 56/77  soft-tissue]
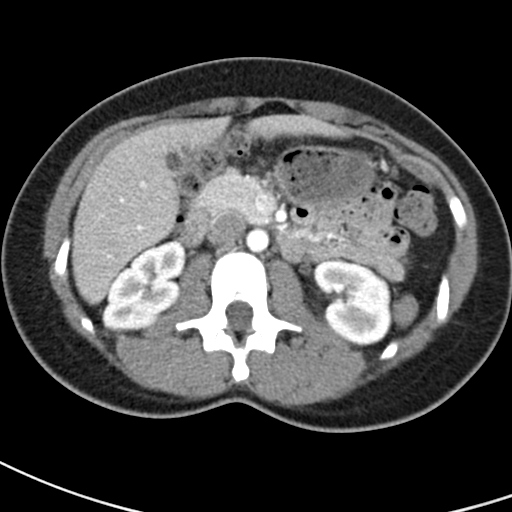
[im 59/77  soft-tissue]
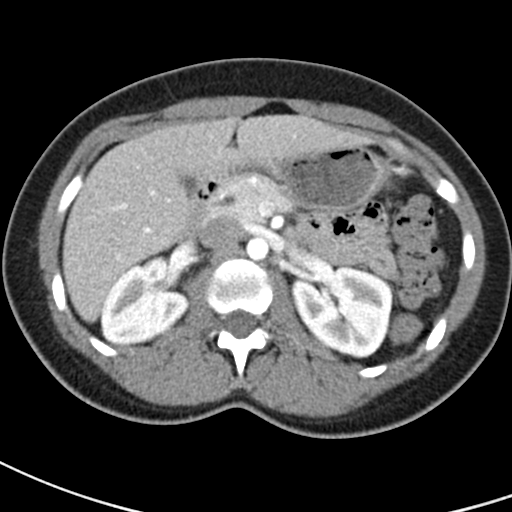
[im 66/77  soft-tissue]
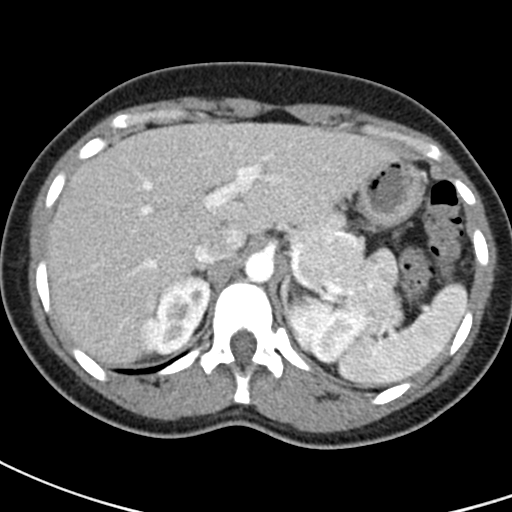
[im 73/77  soft-tissue]
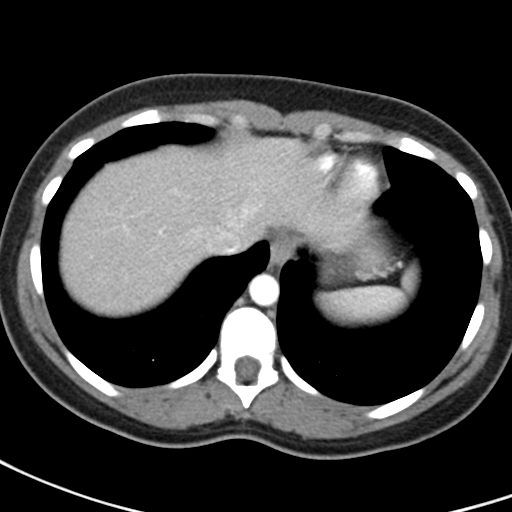

[Series 5: coronal st · coronal · 0.58mm/px · 3 of 109 slices shown]
[im 37/109  soft-tissue]
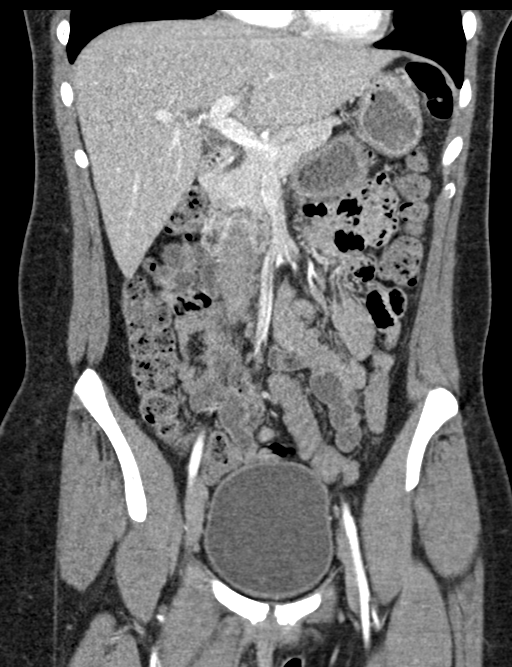
[im 49/109  soft-tissue]
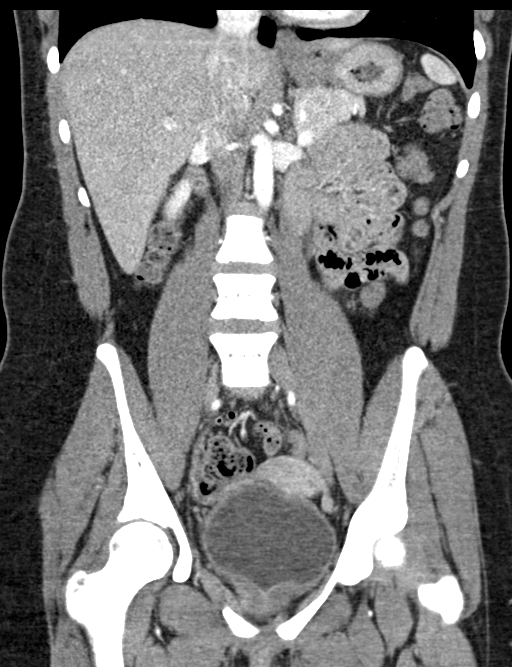
[im 61/109  soft-tissue]
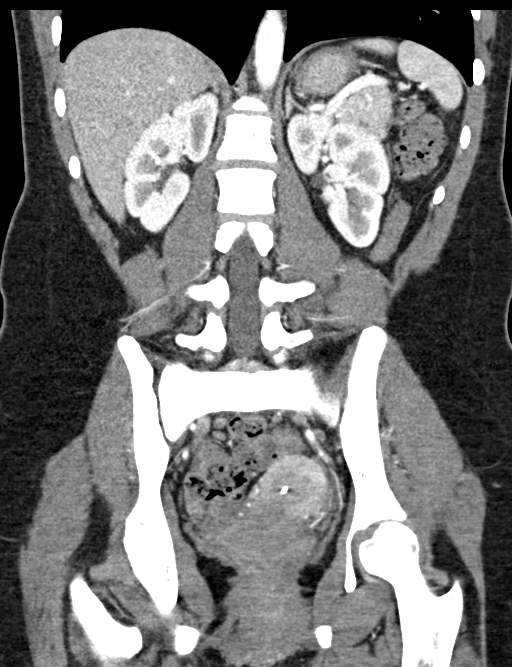

[16 of 46 positions shown; findings below may reference images not displayed]

FINDINGS: Lower chest: Lung bases are clear.

Hepatobiliary: Borderline hepatic steatosis. No focal hepatic
abnormality. Gallbladder minimally distended. No calcified gallstone
or pericholecystic inflammation. No biliary dilatation.

Pancreas: No ductal dilatation or inflammation.

Spleen: Normal in size without focal abnormality.

Adrenals/Urinary Tract: Normal adrenal glands. No hydronephrosis or
perinephric edema. Homogeneous renal enhancement. Urinary bladder is
physiologically distended without wall thickening.

Stomach/Bowel: Bowel evaluation limited in the absence of enteric
contrast and paucity of intra-abdominal fat. No bowel obstruction,
inflammation, or evident wall thickening. Normal appendix, image 46
series 5. Moderate stool burden in the colon. Slight fecalization of
small bowel contents. Stomach is nondistended.

Vascular/Lymphatic: Abdominal aorta and IVC are unremarkable. Patent
portal vein. No adenopathy.

Reproductive: Intrauterine device in the uterus. No adnexal mass.

Other: No free air, free fluid, or intra-abdominal fluid collection.

Musculoskeletal: There are no acute or suspicious osseous
abnormalities.
IMPRESSION: 1. No acute abnormality or explanation for right lower quadrant
pain. Normal appendix.
2. Borderline hepatic steatosis.

## 2020-02-28 ENCOUNTER — Inpatient Hospital Stay (HOSPITAL_COMMUNITY)
Admission: AD | Admit: 2020-02-28 | Discharge: 2020-02-28 | Disposition: A | Discharge status: Home / Self Care | Payer: 59 | Attending: Obstetrics and Gynecology | Admitting: Obstetrics and Gynecology

## 2020-02-28 ENCOUNTER — Encounter (HOSPITAL_COMMUNITY): Payer: Self-pay | Admitting: Obstetrics and Gynecology

## 2020-02-28 ENCOUNTER — Other Ambulatory Visit: Payer: Self-pay

## 2020-02-28 DIAGNOSIS — O26891 Other specified pregnancy related conditions, first trimester: Secondary | ICD-10-CM | POA: Insufficient documentation

## 2020-02-28 DIAGNOSIS — M549 Dorsalgia, unspecified: Secondary | ICD-10-CM | POA: Diagnosis not present

## 2020-02-28 DIAGNOSIS — Z3A08 8 weeks gestation of pregnancy: Secondary | ICD-10-CM | POA: Insufficient documentation

## 2020-02-28 DIAGNOSIS — W19XXXA Unspecified fall, initial encounter: Secondary | ICD-10-CM | POA: Insufficient documentation

## 2020-02-28 DIAGNOSIS — R102 Pelvic and perineal pain: Secondary | ICD-10-CM | POA: Insufficient documentation

## 2020-02-28 LAB — URINALYSIS, ROUTINE W REFLEX MICROSCOPIC
Bilirubin Urine: NEGATIVE
Glucose, UA: 150 mg/dL — AB
Hgb urine dipstick: NEGATIVE
Ketones, ur: 5 mg/dL — AB
Leukocytes,Ua: NEGATIVE
Nitrite: NEGATIVE
Protein, ur: 30 mg/dL — AB
Specific Gravity, Urine: 1.027 (ref 1.005–1.030)
pH: 5 (ref 5.0–8.0)

## 2020-02-28 LAB — POCT PREGNANCY, URINE: Preg Test, Ur: POSITIVE — AB

## 2020-02-28 NOTE — MAU Note (Signed)
Also c/o mid back pain that "shoots up my back" that started since the incident.

## 2020-02-28 NOTE — MAU Note (Signed)
Brandy Howard, CNM discharged patient from triage.  Discharge information and f/u care discussed by provider with patient.  Patient left in good condition.

## 2020-02-28 NOTE — MAU Note (Signed)
Pt states she is [redacted]wks pregnant and had to chase dog down about 30 min ago when he got out of the yard.  She tackled him and he kicked her in the abdomen.  Since then, she's complaining of constant lower abdominal pain 7/10.  Denies vaginal bleeding.

## 2020-02-28 NOTE — MAU Provider Note (Signed)
First Provider Initiated Contact with Patient 02/28/20 1619      S [name] is a 24 y.o. [id number] [redacted]wk pregnant female who presents to MAU today post fall. She was chasing her dog and tackled him, and has had back and pelvic pain since. Denies LOF or VB.  O BP 134/79 (BP Location: Right Arm)   Pulse (!) 122   Temp 98.8 F (37.1 C) (Oral)   Resp 18   Wt 58.6 kg   LMP 01/04/2020 (Exact Date)   SpO2 98%   BMI 22.89 kg/m  Physical Exam  Nursing note and vitals reviewed. Constitutional: She is oriented to person, place, and time. She appears well-developed. She does not appear ill. No distress.  HENT:  Head: Normocephalic and atraumatic.  Cardiovascular: Normal rate and regular rhythm.  Respiratory: Effort normal.  GI: There is no abdominal tenderness.  Neurological: She is alert and oriented to person, place, and time.  Skin: Skin is warm and dry.  Psychiatric: Her behavior is normal. Mood normal.    A Non pregnant female Medical screening exam complete Fall in pregnancy  P Discharge from MAU in stable condition Patient given the option of transfer to [facility] for further evaluation or seek care in outpatient facility of choice List of options for follow-up given  Warning signs for worsening condition that would warrant emergency follow-up discussed Patient may return to MAU as needed for pregnancy related complaints  Gabriel Carina, CNM 02/28/2020 4:48 PM

## 2020-07-31 ENCOUNTER — Inpatient Hospital Stay (HOSPITAL_COMMUNITY)
Admission: AD | Admit: 2020-07-31 | Discharge: 2020-07-31 | Disposition: A | Discharge status: Home / Self Care | Payer: Medicaid Other | Attending: Obstetrics and Gynecology | Admitting: Obstetrics and Gynecology

## 2020-07-31 ENCOUNTER — Other Ambulatory Visit: Payer: Self-pay

## 2020-07-31 ENCOUNTER — Encounter (HOSPITAL_COMMUNITY): Payer: Self-pay | Admitting: Obstetrics and Gynecology

## 2020-07-31 DIAGNOSIS — R42 Dizziness and giddiness: Secondary | ICD-10-CM | POA: Insufficient documentation

## 2020-07-31 DIAGNOSIS — O99891 Other specified diseases and conditions complicating pregnancy: Secondary | ICD-10-CM

## 2020-07-31 DIAGNOSIS — O26853 Spotting complicating pregnancy, third trimester: Secondary | ICD-10-CM

## 2020-07-31 DIAGNOSIS — O26899 Other specified pregnancy related conditions, unspecified trimester: Secondary | ICD-10-CM

## 2020-07-31 DIAGNOSIS — N949 Unspecified condition associated with female genital organs and menstrual cycle: Secondary | ICD-10-CM

## 2020-07-31 DIAGNOSIS — O26893 Other specified pregnancy related conditions, third trimester: Secondary | ICD-10-CM | POA: Insufficient documentation

## 2020-07-31 DIAGNOSIS — Z3A30 30 weeks gestation of pregnancy: Secondary | ICD-10-CM | POA: Insufficient documentation

## 2020-07-31 DIAGNOSIS — R109 Unspecified abdominal pain: Secondary | ICD-10-CM

## 2020-07-31 LAB — CBC
HCT: 30.7 % — ABNORMAL LOW (ref 36.0–46.0)
Hemoglobin: 9.7 g/dL — ABNORMAL LOW (ref 12.0–15.0)
MCH: 25.5 pg — ABNORMAL LOW (ref 26.0–34.0)
MCHC: 31.6 g/dL (ref 30.0–36.0)
MCV: 80.8 fL (ref 80.0–100.0)
Platelets: 225 10*3/uL (ref 150–400)
RBC: 3.8 MIL/uL — ABNORMAL LOW (ref 3.87–5.11)
RDW: 14.6 % (ref 11.5–15.5)
WBC: 11.7 10*3/uL — ABNORMAL HIGH (ref 4.0–10.5)
nRBC: 0.2 % (ref 0.0–0.2)

## 2020-07-31 LAB — URINALYSIS, ROUTINE W REFLEX MICROSCOPIC
Bilirubin Urine: NEGATIVE
Glucose, UA: NEGATIVE mg/dL
Ketones, ur: NEGATIVE mg/dL
Nitrite: NEGATIVE
Protein, ur: 30 mg/dL — AB
Specific Gravity, Urine: 1.02 (ref 1.005–1.030)
pH: 5 (ref 5.0–8.0)

## 2020-07-31 LAB — BASIC METABOLIC PANEL
Anion gap: 10 (ref 5–15)
BUN: 8 mg/dL (ref 6–20)
CO2: 20 mmol/L — ABNORMAL LOW (ref 22–32)
Calcium: 9 mg/dL (ref 8.9–10.3)
Chloride: 107 mmol/L (ref 98–111)
Creatinine, Ser: 0.55 mg/dL (ref 0.44–1.00)
GFR, Estimated: >60 mL/min (ref >60)
Glucose, Bld: 121 mg/dL — ABNORMAL HIGH (ref 70–99)
Potassium: 3.4 mmol/L — ABNORMAL LOW (ref 3.5–5.1)
Sodium: 137 mmol/L (ref 135–145)

## 2020-07-31 LAB — WET PREP, GENITAL
Clue Cells Wet Prep HPF POC: NONE SEEN
Sperm: NONE SEEN
Trich, Wet Prep: NONE SEEN
Yeast Wet Prep HPF POC: NONE SEEN

## 2020-07-31 MED ORDER — ACETAMINOPHEN 500 MG PO TABS
1000.0000 mg | ORAL_TABLET | Freq: Four times a day (QID) | ORAL | Status: DC | PRN
  Administered 2020-07-31: 1000 mg via ORAL
  Filled 2020-07-31: qty 2

## 2020-07-31 NOTE — MAU Provider Note (Addendum)
History     CSN: [id number]  Arrival date and time: 07/31/20 1803   First Provider Initiated Contact with Patient 07/31/20 1938      Chief Complaint  Patient presents with  . Contractions  . Dizziness   24 y.o. G1 @29 .6 wks presenting with episode of dizzyness and lightheadedness. Episode happened this afternoon when she was sitting doing work from home. She had something to eat and drink but didn't feel better then began having sharp LAP. Pain was coming q3 min. Denies LOF. Reports some pink spotting when she wipes. No recent IC. She is currently treating an HSV outbreak. No urinary sx. Reports good FM.   OB History    Gravida  1   Para  0   Term  0   Preterm  0   AB  0   Living  0     SAB  0   TAB  0   Ectopic  0   Multiple  0   Live Births              Past Medical History:  Diagnosis Date  . Anemia   . Anxiety   . Asthma   . Depression    doing ok  . Eczema   . Genital herpes   . Seasonal allergies     Past Surgical History:  Procedure Laterality Date  . NO PAST SURGERIES      Family History  Adopted: Yes  Problem Relation Age of Onset  . Hypertension Father   . Hypertension Mother     Social History   Tobacco Use  . Smoking status: Former Smoker    Types: Cigars  . Smokeless tobacco: Never Used  . Tobacco comment: April 2017  Vaping Use  . Vaping Use: Never used  Substance Use Topics  . Alcohol use: Yes    Comment: Social   . Drug use: No    Allergies:  Allergies  Allergen Reactions  . Dust Mite Extract Itching  . Latex Swelling    Medications Prior to Admission  Medication Sig Dispense Refill Last Dose  . diphenhydrAMINE (BENADRYL) 25 mg capsule Take 25 mg by mouth 2 (two) times daily as needed for allergies.   07/30/2020 at Unknown time  . ferrous sulfate 325 (65 FE) MG tablet Take 325 mg by mouth daily with breakfast.   07/31/2020 at Unknown time  . valACYclovir (VALTREX) 1000 MG tablet Take 1,000 mg by mouth  daily.       Review of Systems  Gastrointestinal: Positive for abdominal pain.  Genitourinary: Positive for urgency and vaginal bleeding. Negative for dysuria, frequency and vaginal discharge.  Musculoskeletal: Positive for back pain.   Physical Exam   Blood pressure 113/74, pulse (!) 145, resp. rate 20, last menstrual period 01/04/2020, SpO2 96 %.  Physical Exam Vitals and nursing note reviewed. Exam conducted with a chaperone present.  Constitutional:      Appearance: Normal appearance.  HENT:     Head: Normocephalic and atraumatic.  Pulmonary:     Effort: Pulmonary effort is normal. No respiratory distress.  Musculoskeletal:        General: Normal range of motion.     Cervical back: Normal and normal range of motion.     Thoracic back: Normal.     Lumbar back: Bony tenderness present.  Skin:    General: Skin is warm and dry.  Neurological:     General: No focal deficit present.     Mental  Status: She is alert and oriented to person, place, and time.  Psychiatric:        Mood and Affect: Mood normal.        Behavior: Behavior normal.   EFM: 155 bpm, mod variability, + accels, no decels Toco: none  No results found for this or any previous visit (from the past 24 hour(s)).  MAU Course  Procedures  MDM Labs ordered. Transfer of care given to Leslye Peer, CNM  07/31/2020 8:04 PM  Results for orders placed or performed during the hospital encounter of 07/31/20 (from the past 24 hour(s))  Wet prep, genital     Status: Abnormal   Collection Time: 07/31/20  7:23 PM   Specimen: Cervix  Result Value Ref Range   Yeast Wet Prep HPF POC NONE SEEN NONE SEEN   Trich, Wet Prep NONE SEEN NONE SEEN   Clue Cells Wet Prep HPF POC NONE SEEN NONE SEEN   WBC, Wet Prep HPF POC MANY (A) NONE SEEN   Sperm NONE SEEN   CBC     Status: Abnormal   Collection Time: 07/31/20  7:46 PM  Result Value Ref Range   WBC 11.7 (H) 4.0 - 10.5 K/uL   RBC 3.80 (L) 3.87 - 5.11  MIL/uL   Hemoglobin 9.7 (L) 12.0 - 15.0 g/dL   HCT 30.7 (L) 36 - 46 %   MCV 80.8 80.0 - 100.0 fL   MCH 25.5 (L) 26.0 - 34.0 pg   MCHC 31.6 30.0 - 36.0 g/dL   RDW 14.6 11.5 - 15.5 %   Platelets 225 150 - 400 K/uL   nRBC 0.2 0.0 - 0.2 %  Basic metabolic panel     Status: Abnormal   Collection Time: 07/31/20  7:46 PM  Result Value Ref Range   Sodium 137 135 - 145 mmol/L   Potassium 3.4 (L) 3.5 - 5.1 mmol/L   Chloride 107 98 - 111 mmol/L   CO2 20 (L) 22 - 32 mmol/L   Glucose, Bld 121 (H) 70 - 99 mg/dL   BUN 8 6 - 20 mg/dL   Creatinine, Ser 0.55 0.44 - 1.00 mg/dL   Calcium 9.0 8.9 - 10.3 mg/dL   GFR, Estimated >60 >60 mL/min   Anion gap 10 5 - 15  Urinalysis, Routine w reflex microscopic Urine, Clean Catch     Status: Abnormal   Collection Time: 07/31/20  9:09 PM  Result Value Ref Range   Color, Urine YELLOW YELLOW   APPearance HAZY (A) CLEAR   Specific Gravity, Urine 1.020 1.005 - 1.030   pH 5.0 5.0 - 8.0   Glucose, UA NEGATIVE NEGATIVE mg/dL   Hgb urine dipstick SMALL (A) NEGATIVE   Bilirubin Urine NEGATIVE NEGATIVE   Ketones, ur NEGATIVE NEGATIVE mg/dL   Protein, ur 30 (A) NEGATIVE mg/dL   Nitrite NEGATIVE NEGATIVE   Leukocytes,Ua SMALL (A) NEGATIVE   RBC / HPF 6-10 0 - 5 RBC/hpf   WBC, UA 6-10 0 - 5 WBC/hpf   Bacteria, UA RARE (A) NONE SEEN   Squamous Epithelial / LPF 0-5 0 - 5   Mucus PRESENT    Dizziness has resolved for now. Discussed this could be related to not drinking or eating enough, possible vertigo. Prior provider stated there was only very light pink discharge on swab FHR remains reassuring Has some round ligament pain.   Assessment and Plan  A:  Single IUP at [redacted]w[redacted]d       Dizziness episode  Abdominal pain in pregnancy, unclear etiology, perhaps round ligament vs cramping        Round ligament pain        Spotting possibly due to perineal irritation r/t HSV outbreak, no blood at cervix  P;   Discharge home        Push fluids, eat 1 4 hrs at  least        May need preg support belt        PTL precautions        Return if sees any more bleeding        Followup in office.         Encouraged to return if she develops worsening of symptoms, increase in pain, fever, or other concerning symptoms.    Seabron Spates, CNM

## 2020-07-31 NOTE — MAU Note (Signed)
Pt's pulse intermittently increases to 120s-130s.

## 2020-07-31 NOTE — MAU Note (Signed)
Pt sated around 1pm she started feeling dizzy and light headed. Had already eaten drank some water and did not feel any better. Decided to lay down for a little while when she got up she noticed her vision ws blurry and started having sharp pelvic pains that lasted about 3 min. Reports a pink mucusy discharge. Good fetal movement felt.

## 2020-07-31 NOTE — Discharge Instructions (Signed)
Dizziness Dizziness is a common problem. It makes you feel unsteady or light-headed. You may feel like you are about to pass out (faint). Dizziness can lead to getting hurt if you stumble or fall. Dizziness can be caused by many things, including:  Medicines.  Not having enough water in your body (dehydration).  Illness. Follow these instructions at home: Eating and drinking   Drink enough fluid to keep your pee (urine) clear or pale yellow. This helps to keep you from getting dehydrated. Try to drink more clear fluids, such as water.  Do not drink alcohol.  Limit how much caffeine you drink or eat, if your doctor tells you to do that.  Limit how much salt (sodium) you drink or eat, if your doctor tells you to do that. Activity   Avoid making quick movements. ? When you stand up from sitting in a chair, steady yourself until you feel okay. ? In the morning, first sit up on the side of the bed. When you feel okay, stand slowly while you hold onto something. Do this until you know that your balance is fine.  If you need to stand in one place for a long time, move your legs often. Tighten and relax the muscles in your legs while you are standing.  Do not drive or use heavy machinery if you feel dizzy.  Avoid bending down if you feel dizzy. Place items in your home so you can reach them easily without leaning over. Lifestyle  Do not use any products that contain nicotine or tobacco, such as cigarettes and e-cigarettes. If you need help quitting, ask your doctor.  Try to lower your stress level. You can do this by using methods such as yoga or meditation. Talk with your doctor if you need help. General instructions  Watch your dizziness for any changes.  Take over-the-counter and prescription medicines only as told by your doctor. Talk with your doctor if you think that you are dizzy because of a medicine that you are taking.  Tell a friend or a family member that you are feeling  dizzy. If he or she notices any changes in your behavior, have this person call your doctor.  Keep all follow-up visits as told by your doctor. This is important. Contact a doctor if:  Your dizziness does not go away.  Your dizziness or light-headedness gets worse.  You feel sick to your stomach (nauseous).  You have trouble hearing.  You have new symptoms.  You are unsteady on your feet.  You feel like the room is spinning. Get help right away if:  You throw up (vomit) or have watery poop (diarrhea), and you cannot eat or drink anything.  You have trouble: ? Talking. ? Walking. ? Swallowing. ? Using your arms, hands, or legs.  You feel generally weak.  You are not thinking clearly, or you have trouble forming sentences. A friend or family member may notice this.  You have: ? Chest pain. ? Pain in your belly (abdomen). ? Shortness of breath. ? Sweating.  Your vision changes.  You are bleeding.  You have a very bad headache.  You have neck pain or a stiff neck.  You have a fever. These symptoms may be an emergency. Do not wait to see if the symptoms will go away. Get medical help right away. Call your local emergency services (911 in the [place] Do not drive yourself to the hospital. Summary  Dizziness makes you feel unsteady or light-headed. You   may feel like you are about to pass out (faint).  Drink enough fluid to keep your pee (urine) clear or pale yellow. Do not drink alcohol.  Avoid making quick movements if you feel dizzy.  Watch your dizziness for any changes. This information is not intended to replace advice given to you by your health care provider. Make sure you discuss any questions you have with your health care provider. Document Revised: 09/02/2017 Document Reviewed: 09/16/2016 Elsevier Patient Education  [place].   Abdominal Pain During Pregnancy  Abdominal pain is common during pregnancy, and has many possible causes. Some  causes are more serious than others, and sometimes the cause is not known. Abdominal pain can be a sign that labor is starting. It can also be caused by normal growth and stretching of muscles and ligaments during pregnancy. Always tell your health care provider if you have any abdominal pain. Follow these instructions at home:  Do not have sex or put anything in your vagina until your pain goes away completely.  Get plenty of rest until your pain improves.  Drink enough fluid to keep your urine pale yellow.  Take over-the-counter and prescription medicines only as told by your health care provider.  Keep all follow-up visits as told by your health care provider. This is important. Contact a health care provider if:  Your pain continues or gets worse after resting.  You have lower abdominal pain that: ? Comes and goes at regular intervals. ? Spreads to your back. ? Is similar to menstrual cramps.  You have pain or burning when you urinate. Get help right away if:  You have a fever or chills.  You have vaginal bleeding.  You are leaking fluid from your vagina.  You are passing tissue from your vagina.  You have vomiting or diarrhea that lasts for more than 24 hours.  Your baby is moving less than usual.  You feel very weak or faint.  You have shortness of breath.  You develop severe pain in your upper abdomen. Summary  Abdominal pain is common during pregnancy, and has many possible causes.  If you experience abdominal pain during pregnancy, tell your health care provider right away.  Follow your health care provider's home care instructions and keep all follow-up visits as directed. This information is not intended to replace advice given to you by your health care provider. Make sure you discuss any questions you have with your health care provider. Document Revised: 12/18/2018 Document Reviewed: 12/02/2016 Elsevier Patient Education  [place].

## 2020-08-01 LAB — GC/CHLAMYDIA PROBE AMP (~~LOC~~) NOT AT ARMC
Chlamydia: NEGATIVE
Comment: NEGATIVE
Comment: NORMAL
Neisseria Gonorrhea: NEGATIVE

## 2020-09-13 NOTE — L&D Delivery Note (Signed)
Delivery Note Patient complete and +2 station. At 12:40 PM a viable and healthy female was delivered via Vaginal, Spontaneous (Presentation: Left Occiput Anterior).  APGAR: 8 and 9 weight - see infant's chart  Placenta status: Intact, 3 vessel cord , sent to pathology .  Cord: 3 vessels with the following complications: None.  Cord pH: Cord gases sent  Anesthesia: Epidural Episiotomy: None Lacerations:  Bilateral vaginal sulcal tears Suture Repair: 3.0 vicryl Est. Blood Loss (mL):  300cc  Cytotec 810mcg was given per rectum for prophylaxis.  Mom to postpartum.  Baby to Couplet care / Skin to Skin.  [name] 09/26/2020, 1:03 PM

## 2020-09-24 ENCOUNTER — Telehealth (HOSPITAL_COMMUNITY): Payer: Self-pay | Admitting: *Deleted

## 2020-09-24 ENCOUNTER — Other Ambulatory Visit: Payer: Self-pay

## 2020-09-24 ENCOUNTER — Encounter (HOSPITAL_COMMUNITY): Payer: Self-pay | Admitting: Obstetrics and Gynecology

## 2020-09-24 ENCOUNTER — Inpatient Hospital Stay (HOSPITAL_COMMUNITY)
Admission: AD | Admit: 2020-09-24 | Discharge: 2020-09-28 | DRG: 806 | Disposition: A | Discharge status: Home / Self Care | Payer: Medicaid Other | Attending: Obstetrics and Gynecology | Admitting: Obstetrics and Gynecology

## 2020-09-24 DIAGNOSIS — Z3A38 38 weeks gestation of pregnancy: Secondary | ICD-10-CM | POA: Diagnosis not present

## 2020-09-24 DIAGNOSIS — F419 Anxiety disorder, unspecified: Secondary | ICD-10-CM

## 2020-09-24 DIAGNOSIS — Z20822 Contact with and (suspected) exposure to covid-19: Secondary | ICD-10-CM | POA: Diagnosis present

## 2020-09-24 DIAGNOSIS — F32A Depression, unspecified: Secondary | ICD-10-CM

## 2020-09-24 DIAGNOSIS — A6000 Herpesviral infection of urogenital system, unspecified: Secondary | ICD-10-CM | POA: Diagnosis present

## 2020-09-24 DIAGNOSIS — O403XX0 Polyhydramnios, third trimester, not applicable or unspecified: Secondary | ICD-10-CM | POA: Diagnosis present

## 2020-09-24 DIAGNOSIS — O9832 Other infections with a predominantly sexual mode of transmission complicating childbirth: Secondary | ICD-10-CM | POA: Diagnosis present

## 2020-09-24 DIAGNOSIS — O99344 Other mental disorders complicating childbirth: Secondary | ICD-10-CM | POA: Diagnosis present

## 2020-09-24 DIAGNOSIS — Z23 Encounter for immunization: Secondary | ICD-10-CM | POA: Diagnosis not present

## 2020-09-24 DIAGNOSIS — O139 Gestational [pregnancy-induced] hypertension without significant proteinuria, unspecified trimester: Secondary | ICD-10-CM | POA: Diagnosis not present

## 2020-09-24 DIAGNOSIS — O99824 Streptococcus B carrier state complicating childbirth: Secondary | ICD-10-CM | POA: Diagnosis present

## 2020-09-24 DIAGNOSIS — Z3490 Encounter for supervision of normal pregnancy, unspecified, unspecified trimester: Secondary | ICD-10-CM | POA: Diagnosis present

## 2020-09-24 LAB — CBC
HCT: 39.2 % (ref 36.0–46.0)
Hemoglobin: 12.3 g/dL (ref 12.0–15.0)
MCH: 26.3 pg (ref 26.0–34.0)
MCHC: 31.4 g/dL (ref 30.0–36.0)
MCV: 83.9 fL (ref 80.0–100.0)
Platelets: 216 10*3/uL (ref 150–400)
RBC: 4.67 MIL/uL (ref 3.87–5.11)
RDW: 19.5 % — ABNORMAL HIGH (ref 11.5–15.5)
WBC: 7.4 10*3/uL (ref 4.0–10.5)
nRBC: 0 % (ref 0.0–0.2)

## 2020-09-24 LAB — TYPE AND SCREEN
ABO/RH(D): O POS
Antibody Screen: NEGATIVE

## 2020-09-24 LAB — HEPATITIS C ANTIBODY: HCV Ab: NEGATIVE

## 2020-09-24 LAB — RESP PANEL BY RT-PCR (FLU A&B, COVID) ARPGX2
Influenza A by PCR: NEGATIVE
Influenza B by PCR: NEGATIVE
SARS Coronavirus 2 by RT PCR: NEGATIVE

## 2020-09-24 LAB — OB RESULTS CONSOLE GC/CHLAMYDIA
Chlamydia: NEGATIVE
Gonorrhea: NEGATIVE

## 2020-09-24 LAB — OB RESULTS CONSOLE RUBELLA ANTIBODY, IGM: Rubella: IMMUNE

## 2020-09-24 LAB — OB RESULTS CONSOLE HEPATITIS B SURFACE ANTIGEN: Hepatitis B Surface Ag: NEGATIVE

## 2020-09-24 MED ORDER — OXYTOCIN BOLUS FROM INFUSION
333.0000 mL | Freq: Once | INTRAVENOUS | Status: AC
  Administered 2020-09-26: 333 mL via INTRAVENOUS

## 2020-09-24 MED ORDER — LIDOCAINE HCL (PF) 1 % IJ SOLN: 30.0000 mL | INTRAMUSCULAR | Status: DC | PRN

## 2020-09-24 MED ORDER — OXYCODONE-ACETAMINOPHEN 5-325 MG PO TABS: 1.0000 | ORAL_TABLET | ORAL | Status: DC | PRN

## 2020-09-24 MED ORDER — VALACYCLOVIR HCL 500 MG PO TABS
500.0000 mg | ORAL_TABLET | Freq: Two times a day (BID) | ORAL | Status: DC
  Administered 2020-09-24 – 2020-09-28 (×8): 500 mg via ORAL
  Filled 2020-09-24 (×8): qty 1

## 2020-09-24 MED ORDER — MISOPROSTOL 25 MCG QUARTER TABLET
25.0000 ug | ORAL_TABLET | ORAL | Status: DC | PRN
  Administered 2020-09-24 (×2): 25 ug via VAGINAL
  Filled 2020-09-24 (×2): qty 1

## 2020-09-24 MED ORDER — FENTANYL CITRATE (PF) 100 MCG/2ML IJ SOLN
50.0000 ug | INTRAMUSCULAR | Status: DC | PRN
  Administered 2020-09-25 (×5): 100 ug via INTRAVENOUS
  Administered 2020-09-25: 50 ug via INTRAVENOUS
  Filled 2020-09-24 (×6): qty 2

## 2020-09-24 MED ORDER — LACTATED RINGERS IV SOLN: INTRAVENOUS | Status: DC

## 2020-09-24 MED ORDER — OXYTOCIN-SODIUM CHLORIDE 30-0.9 UT/500ML-% IV SOLN
1.0000 m[IU]/min | INTRAVENOUS | Status: DC
  Filled 2020-09-24: qty 500

## 2020-09-24 MED ORDER — SOD CITRATE-CITRIC ACID 500-334 MG/5ML PO SOLN: 30.0000 mL | ORAL | Status: DC | PRN

## 2020-09-24 MED ORDER — LACTATED RINGERS IV SOLN
500.0000 mL | INTRAVENOUS | Status: DC | PRN
  Administered 2020-09-24 – 2020-09-26 (×2): 1000 mL via INTRAVENOUS

## 2020-09-24 MED ORDER — DIPHENHYDRAMINE HCL 50 MG/ML IJ SOLN
25.0000 mg | Freq: Every evening | INTRAMUSCULAR | Status: DC | PRN
  Administered 2020-09-24: 25 mg via INTRAVENOUS
  Filled 2020-09-24: qty 1

## 2020-09-24 MED ORDER — TERBUTALINE SULFATE 1 MG/ML IJ SOLN: 0.2500 mg | Freq: Once | INTRAMUSCULAR | Status: DC | PRN

## 2020-09-24 MED ORDER — OXYTOCIN-SODIUM CHLORIDE 30-0.9 UT/500ML-% IV SOLN
2.5000 [IU]/h | INTRAVENOUS | Status: DC
  Filled 2020-09-24: qty 500

## 2020-09-24 MED ORDER — ONDANSETRON HCL 4 MG/2ML IJ SOLN
4.0000 mg | Freq: Four times a day (QID) | INTRAMUSCULAR | Status: DC | PRN
  Administered 2020-09-24 – 2020-09-25 (×2): 4 mg via INTRAVENOUS
  Filled 2020-09-24 (×2): qty 2

## 2020-09-24 MED ORDER — ACETAMINOPHEN 325 MG PO TABS
650.0000 mg | ORAL_TABLET | ORAL | Status: DC | PRN
  Administered 2020-09-25 (×3): 650 mg via ORAL
  Filled 2020-09-24 (×3): qty 2

## 2020-09-24 MED ORDER — SERTRALINE HCL 25 MG PO TABS
25.0000 mg | ORAL_TABLET | Freq: Every day | ORAL | Status: DC
  Administered 2020-09-24 – 2020-09-27 (×4): 25 mg via ORAL
  Filled 2020-09-24 (×4): qty 1

## 2020-09-24 MED ORDER — FLUCONAZOLE 150 MG PO TABS
150.0000 mg | ORAL_TABLET | Freq: Once | ORAL | Status: AC
  Administered 2020-09-24: 150 mg via ORAL
  Filled 2020-09-24: qty 1

## 2020-09-24 MED ORDER — SODIUM CHLORIDE 0.9 % IV SOLN
5.0000 10*6.[IU] | Freq: Once | INTRAVENOUS | Status: AC
  Administered 2020-09-24: 5 10*6.[IU] via INTRAVENOUS
  Filled 2020-09-24: qty 5

## 2020-09-24 MED ORDER — PENICILLIN G POT IN DEXTROSE 60000 UNIT/ML IV SOLN
3.0000 10*6.[IU] | INTRAVENOUS | Status: DC
  Administered 2020-09-24 – 2020-09-26 (×9): 3 10*6.[IU] via INTRAVENOUS
  Filled 2020-09-24 (×8): qty 50

## 2020-09-24 MED ORDER — OXYCODONE-ACETAMINOPHEN 5-325 MG PO TABS
2.0000 | ORAL_TABLET | ORAL | Status: DC | PRN
Start: 2020-09-24 — End: 2020-09-26

## 2020-09-24 NOTE — H&P (Signed)
HPI: 25 y.o. G1P0000 @ 52w0destimated gestational age (as dated by LMP c/w 21 week ultrasound) presents for induction of labor for 6/8 BPP, polyhydramnios, and echogenic particles within the amniotic fluid.  Leakage of fluid:  No Vaginal bleeding:  No Contractions:  No Fetal movement:  Decreased  Prenatal care has been provided by Dr. [name] [facility])  ROS:  Denies fevers, chills, chest pain, visual changes, SOB, RUQ/epigastric pain, N/V, dysuria, hematuria, or sudden onset/worsening bilateral LE or facial edema.  Pregnancy complicated by: Polyhydramnios Echogenic particles in amniotic fluid on [place] HSV-2 Anxiety/depression   Prenatal Transfer Tool  Maternal Diabetes: No Genetic Screening: Normal Maternal Ultrasounds/Referrals: Other:Poly, echogenic particles in amniotic fluid Fetal Ultrasounds or other Referrals:  None Maternal Substance Abuse:  No Significant Maternal Medications:  None Significant Maternal Lab Results: Group B Strep positive   Prenatal Labs Blood type:  O positive Antibody screen:  Negative CBC:  H/H 10/32 Rubella: Immune RPR:  Non-reactive Hep B:  Negative Hep C:  Negative GC/CT:  Negative Glucola:  129 (wnl)  Immunizations: Tdap: Given prenatally Flu: Unknown  OBHx:  OB History    Gravida  1   Para  0   Term  0   Preterm  0   AB  0   Living  0     SAB  0   IAB  0   Ectopic  0   Multiple  0   Live Births             PMHx:  Anxiety/depression, HSV2 Meds:  PNV, Zoloft 25mg  daily Allergy:   Allergies  Allergen Reactions  . Dust Mite Extract Itching  . Latex Swelling   SurgHx: None SocHx:   Denies Tobacco, ETOH, illicit drugs  O: LMP 44/11/4740 (Exact Date)  Gen. AAOx3, NAD CV.  RRR  Resp. CTAB, no wheezes/rales/rhonchi Abd. Gravid, soft, non-tender throughout, no rebound/guarding Extr.  no bilateral LE edema, no calf tenderness bilaterally SVE: closed/thick/high, firm, posterior SSE:  Yeast present  in vaginal vault, no perineal, vaginal, or cervical HSV lesions   Last growth [place] (1/5):  EFW 3084g (56%), AFI 28, echogenic particles in amniotic fluid, posterior placenta  Last [place] (1/12):  BPP 6/8 (off for breathing), echogenic particles still present, cephalic  Labs: see orders  A/P:  25 y.o. G1P0000 @ [redacted]w[redacted]d who presents for IOL for 6/8 BPP and polyhydramnios.  IOL - 6/8 BPP and Polyhydramnios with echogenic material within amniotic fluid - Admit to L&D - Admit labs (CBC, T&S, COVID screen) - CEFM/Toco - Diet:  Clears - IVF:  LR at 125cc/hour - VTE Prophylaxis:  SCDs - GBS Status:  Positive - PCN ordered for when in active labor - Presentation:  Cephalic on [place] - Pain control:  Per patient request - Induction method:  Cytotec  Polyhydramnios - Last AFI 26 on 1/12  Echogenic material in amniotic fluid - Suspect meconium  HSV-2 - Valtrex suppression ordered - No current lesions visualized  Anxiety/depression - Medications:  Zoloft 25mg  daily - Needs a 2 week EPDS postpartum  [name], DO [phone] (office)

## 2020-09-24 NOTE — H&P (Signed)
HPI: 24 y.o. G1P0000 @ 38w0destimated gestational age (as dated by LMP c/w 21 week ultrasound) presents for induction of labor for 6/8 BPP, polyhydramnios, and echogenic particles within the amniotic fluid.  Leakage of fluid:  No Vaginal bleeding:  No Contractions:  No Fetal movement:  Decreased  Prenatal care has been provided by Dr. Corban Kistler ([facility])  ROS:  Denies fevers, chills, chest pain, visual changes, SOB, RUQ/epigastric pain, N/V, dysuria, hematuria, or sudden onset/worsening bilateral LE or facial edema.  Pregnancy complicated by: Polyhydramnios Echogenic particles in amniotic fluid on [place] HSV-2 Anxiety/depression   Prenatal Transfer Tool  Maternal Diabetes: No Genetic Screening: Normal Maternal Ultrasounds/Referrals: Other:Poly, echogenic particles in amniotic fluid Fetal Ultrasounds or other Referrals:  None Maternal Substance Abuse:  No Significant Maternal Medications:  None Significant Maternal Lab Results: Group B Strep positive   Prenatal Labs Blood type:  O positive Antibody screen:  Negative CBC:  H/H 10/32 Rubella: Immune RPR:  Non-reactive Hep B:  Negative Hep C:  Negative GC/CT:  Negative Glucola:  129 (wnl)  Immunizations: Tdap: Given prenatally Flu: Unknown  OBHx:  OB History    Gravida  1   Para  0   Term  0   Preterm  0   AB  0   Living  0     SAB  0   IAB  0   Ectopic  0   Multiple  0   Live Births             PMHx:  Anxiety/depression, HSV2 Meds:  PNV, Zoloft 25mg daily Allergy:   Allergies  Allergen Reactions  . Dust Mite Extract Itching  . Latex Swelling   SurgHx: None SocHx:   Denies Tobacco, ETOH, illicit drugs  O: LMP 01/04/2020 (Exact Date)  Gen. AAOx3, NAD CV.  RRR  Resp. CTAB, no wheezes/rales/rhonchi Abd. Gravid, soft, non-tender throughout, no rebound/guarding Extr.  no bilateral LE edema, no calf tenderness bilaterally SVE: closed/thick/high, firm, posterior SSE:  Yeast present  in vaginal vault, no perineal, vaginal, or cervical HSV lesions   Last growth US (1/5):  EFW 3084g (56%), AFI 28, echogenic particles in amniotic fluid, posterior placenta  Last US (1/12):  BPP 6/8 (off for breathing), echogenic particles still present, cephalic  Labs: see orders  A/P:  24 y.o. G1P0000 @ [redacted]w[redacted]d who presents for IOL for 6/8 BPP and polyhydramnios.  IOL - 6/8 BPP and Polyhydramnios with echogenic material within amniotic fluid - Admit to L&D - Admit labs (CBC, T&S, COVID screen) - CEFM/Toco - Diet:  Clears - IVF:  LR at 125cc/hour - VTE Prophylaxis:  SCDs - GBS Status:  Positive - PCN ordered for when in active labor - Presentation:  Cephalic on US - Pain control:  Per patient request - Induction method:  Cytotec  Polyhydramnios - Last AFI 26 on 1/12  Echogenic material in amniotic fluid - Suspect meconium  HSV-2 - Valtrex suppression ordered - No current lesions visualized  Anxiety/depression - Medications:  Zoloft 25mg daily - Needs a 2 week EPDS postpartum  Swati Granberry, DO [phone] (office)      

## 2020-09-24 NOTE — Telephone Encounter (Signed)
Preadmission screen  

## 2020-09-25 LAB — PROTEIN / CREATININE RATIO, URINE
Creatinine, Urine: 48.8 mg/dL
Protein Creatinine Ratio: 0.59 mg/mg{Cre} — ABNORMAL HIGH (ref 0.00–0.15)
Total Protein, Urine: 29 mg/dL

## 2020-09-25 LAB — CBC WITH DIFFERENTIAL/PLATELET
Abs Immature Granulocytes: 0.14 10*3/uL — ABNORMAL HIGH (ref 0.00–0.07)
Basophils Absolute: 0.1 10*3/uL (ref 0.0–0.1)
Basophils Relative: 1 %
Eosinophils Absolute: 0.1 10*3/uL (ref 0.0–0.5)
Eosinophils Relative: 1 %
HCT: 40.4 % (ref 36.0–46.0)
Hemoglobin: 12.2 g/dL (ref 12.0–15.0)
Immature Granulocytes: 2 %
Lymphocytes Relative: 14 %
Lymphs Abs: 1.3 10*3/uL (ref 0.7–4.0)
MCH: 26.2 pg (ref 26.0–34.0)
MCHC: 30.2 g/dL (ref 30.0–36.0)
MCV: 86.9 fL (ref 80.0–100.0)
Monocytes Absolute: 1.3 10*3/uL — ABNORMAL HIGH (ref 0.1–1.0)
Monocytes Relative: 14 %
Neutro Abs: 6.4 10*3/uL (ref 1.7–7.7)
Neutrophils Relative %: 68 %
Platelets: 191 10*3/uL (ref 150–400)
RBC: 4.65 MIL/uL (ref 3.87–5.11)
RDW: 19.4 % — ABNORMAL HIGH (ref 11.5–15.5)
WBC: 9.2 10*3/uL (ref 4.0–10.5)
nRBC: 0 % (ref 0.0–0.2)

## 2020-09-25 LAB — COMPREHENSIVE METABOLIC PANEL
ALT: 18 U/L (ref 0–44)
AST: 28 U/L (ref 15–41)
Albumin: 2.4 g/dL — ABNORMAL LOW (ref 3.5–5.0)
Alkaline Phosphatase: 206 U/L — ABNORMAL HIGH (ref 38–126)
Anion gap: 12 (ref 5–15)
BUN: 6 mg/dL (ref 6–20)
CO2: 16 mmol/L — ABNORMAL LOW (ref 22–32)
Calcium: 8.9 mg/dL (ref 8.9–10.3)
Chloride: 107 mmol/L (ref 98–111)
Creatinine, Ser: 0.67 mg/dL (ref 0.44–1.00)
GFR, Estimated: >60 mL/min (ref >60)
Glucose, Bld: 103 mg/dL — ABNORMAL HIGH (ref 70–99)
Potassium: 4.1 mmol/L (ref 3.5–5.1)
Sodium: 135 mmol/L (ref 135–145)
Total Bilirubin: 0.2 mg/dL — ABNORMAL LOW (ref 0.3–1.2)
Total Protein: 6 g/dL — ABNORMAL LOW (ref 6.5–8.1)

## 2020-09-25 LAB — LACTATE DEHYDROGENASE: LDH: 141 U/L (ref 98–192)

## 2020-09-25 LAB — RPR: RPR Ser Ql: NONREACTIVE

## 2020-09-25 MED ORDER — OXYTOCIN-SODIUM CHLORIDE 30-0.9 UT/500ML-% IV SOLN
1.0000 m[IU]/min | INTRAVENOUS | Status: DC
  Administered 2020-09-26: 2 m[IU]/min via INTRAVENOUS

## 2020-09-25 MED ORDER — OXYTOCIN-SODIUM CHLORIDE 30-0.9 UT/500ML-% IV SOLN
1.0000 m[IU]/min | INTRAVENOUS | Status: DC
  Administered 2020-09-25: 1 m[IU]/min via INTRAVENOUS

## 2020-09-25 MED ORDER — DIPHENHYDRAMINE HCL 50 MG/ML IJ SOLN
25.0000 mg | Freq: Four times a day (QID) | INTRAMUSCULAR | Status: DC | PRN
  Administered 2020-09-25 (×2): 25 mg via INTRAVENOUS
  Filled 2020-09-25 (×2): qty 1

## 2020-09-25 NOTE — Progress Notes (Signed)
OB Progress Note  S: Patient resting comfortably. Reports mild contractions every few minutes  O: BP 120/61   Pulse 85   Temp 98 F (36.7 C) (Oral)   Resp 18   Ht 5\' 3"  (1.6 m)   Wt 78.5 kg   LMP 01/02/2020   BMI 30.65 kg/m   FHT: 150bpm, moderate variablity, - accels, - decels Toco: q1-1.5 SVE: 1-2/50/high, posterior --> after FB attempt patient 2/50/high  A/P: 25 y.o. G1P0000  @ [redacted]w[redacted]d admitted for IOL - 6/8 BPP in setting of polyhydramnios and echogenic material within amniotic fluid.  FWB: Cat. I Labor course: S/p Cytotec x 2, Attempted FB placement but this was difficult due to poor intolerance and angle of cervix.  Will continue ripening with Pitocin 1x1 and reassess Pain: Per patient request GBS: Positive - receiving PCN  [name], DO [phone] (office)

## 2020-09-25 NOTE — Progress Notes (Signed)
OB Progress Note  S: Patient reports pain with contractions  O: BP 111/68   Pulse 80   Temp 98.3 F (36.8 C) (Oral)   Resp 14   Ht 5\' 3"  (1.6 m)   Wt 78.5 kg   LMP 01/02/2020   BMI 30.65 kg/m   FHT: 140bpm, moderate variablity, + accels, - decels Toco: q1-2 minutes SVE: 3/60/-3, sutures palpated, engaged AROM:  Clear, non-odorous, IUPC placed  A/P: 25 y.o. G1P0000  @ [redacted]w[redacted]d admitted for IOL - 6/8 BPP in setting of polyhydramnios and echogenic material within amniotic fluid.  FWB: Cat. I Labor course: S/p Cytotec x 2, s/p AROM, IUPC placed, Pitocin at 78mU/min Pain: Per patient request GBS: Positive - receiving PCN Anticipate SVD  [name], DO [phone] (office)

## 2020-09-26 ENCOUNTER — Inpatient Hospital Stay (HOSPITAL_COMMUNITY): Payer: Medicaid Other | Admitting: Anesthesiology

## 2020-09-26 LAB — PROTEIN / CREATININE RATIO, URINE
Creatinine, Urine: 54.74 mg/dL
Protein Creatinine Ratio: 0.18 mg/mg{Cre} — ABNORMAL HIGH (ref 0.00–0.15)
Total Protein, Urine: 10 mg/dL

## 2020-09-26 LAB — RAPID HIV SCREEN (HIV 1/2 AB+AG)
HIV 1/2 Antibodies: NONREACTIVE
HIV-1 P24 Antigen - HIV24: NONREACTIVE

## 2020-09-26 MED ORDER — OXYCODONE HCL 5 MG PO TABS
10.0000 mg | ORAL_TABLET | ORAL | Status: DC | PRN
  Administered 2020-09-26: 10 mg via ORAL
  Filled 2020-09-26 (×2): qty 2

## 2020-09-26 MED ORDER — COCONUT OIL OIL
1.0000 "application " | TOPICAL_OIL | Status: DC | PRN
  Administered 2020-09-27: 1 via TOPICAL

## 2020-09-26 MED ORDER — MISOPROSTOL 200 MCG PO TABS
ORAL_TABLET | ORAL | Status: AC
  Filled 2020-09-26: qty 5

## 2020-09-26 MED ORDER — MISOPROSTOL 200 MCG PO TABS
800.0000 ug | ORAL_TABLET | Freq: Once | ORAL | Status: AC
  Administered 2020-09-26: 800 ug via RECTAL

## 2020-09-26 MED ORDER — TETANUS-DIPHTH-ACELL PERTUSSIS 5-2.5-18.5 LF-MCG/0.5 IM SUSY
0.5000 mL | PREFILLED_SYRINGE | Freq: Once | INTRAMUSCULAR | Status: DC
  Filled 2020-09-26: qty 0.5

## 2020-09-26 MED ORDER — MISOPROSTOL 200 MCG PO TABS: 1000.0000 ug | ORAL_TABLET | Freq: Once | ORAL | Status: DC

## 2020-09-26 MED ORDER — ZOLPIDEM TARTRATE 5 MG PO TABS
5.0000 mg | ORAL_TABLET | Freq: Every evening | ORAL | Status: DC | PRN
  Administered 2020-09-28: 5 mg via ORAL
  Filled 2020-09-26: qty 1

## 2020-09-26 MED ORDER — FENTANYL-BUPIVACAINE-NACL 0.5-0.125-0.9 MG/250ML-% EP SOLN
EPIDURAL | Status: AC
  Administered 2020-09-26: 500 ug
  Filled 2020-09-26: qty 250

## 2020-09-26 MED ORDER — ACETAMINOPHEN 325 MG PO TABS
650.0000 mg | ORAL_TABLET | ORAL | Status: DC | PRN
  Administered 2020-09-27 – 2020-09-28 (×2): 650 mg via ORAL
  Filled 2020-09-26 (×2): qty 2

## 2020-09-26 MED ORDER — WITCH HAZEL-GLYCERIN EX PADS
1.0000 "application " | MEDICATED_PAD | CUTANEOUS | Status: DC | PRN
  Administered 2020-09-26: 1 via TOPICAL

## 2020-09-26 MED ORDER — DIBUCAINE (PERIANAL) 1 % EX OINT
1.0000 "application " | TOPICAL_OINTMENT | CUTANEOUS | Status: DC | PRN
  Administered 2020-09-26: 1 via RECTAL
  Filled 2020-09-26: qty 28

## 2020-09-26 MED ORDER — BENZOCAINE-MENTHOL 20-0.5 % EX AERO
1.0000 "application " | INHALATION_SPRAY | CUTANEOUS | Status: DC | PRN
  Administered 2020-09-26: 1 via TOPICAL
  Filled 2020-09-26: qty 56

## 2020-09-26 MED ORDER — OXYCODONE HCL 5 MG PO TABS
5.0000 mg | ORAL_TABLET | ORAL | Status: DC | PRN
  Administered 2020-09-26 – 2020-09-27 (×2): 5 mg via ORAL
  Filled 2020-09-26: qty 1

## 2020-09-26 MED ORDER — ONDANSETRON HCL 4 MG/2ML IJ SOLN: 4.0000 mg | INTRAMUSCULAR | Status: DC | PRN

## 2020-09-26 MED ORDER — DIPHENHYDRAMINE HCL 25 MG PO CAPS: 25.0000 mg | ORAL_CAPSULE | Freq: Four times a day (QID) | ORAL | Status: DC | PRN

## 2020-09-26 MED ORDER — PRENATAL MULTIVITAMIN CH
1.0000 | ORAL_TABLET | Freq: Every day | ORAL | Status: DC
  Administered 2020-09-27: 1 via ORAL
  Filled 2020-09-26: qty 1

## 2020-09-26 MED ORDER — SIMETHICONE 80 MG PO CHEW: 80.0000 mg | CHEWABLE_TABLET | ORAL | Status: DC | PRN

## 2020-09-26 MED ORDER — LIDOCAINE HCL (PF) 1 % IJ SOLN
INTRAMUSCULAR | Status: DC | PRN
  Administered 2020-09-26: 10 mL via EPIDURAL

## 2020-09-26 MED ORDER — SODIUM CHLORIDE (PF) 0.9 % IJ SOLN
INTRAMUSCULAR | Status: DC | PRN
  Administered 2020-09-26: 12 mL/h via EPIDURAL

## 2020-09-26 MED ORDER — SENNOSIDES-DOCUSATE SODIUM 8.6-50 MG PO TABS
2.0000 | ORAL_TABLET | Freq: Every day | ORAL | Status: DC
  Administered 2020-09-27 – 2020-09-28 (×2): 2 via ORAL
  Filled 2020-09-26 (×2): qty 2

## 2020-09-26 MED ORDER — ONDANSETRON HCL 4 MG PO TABS: 4.0000 mg | ORAL_TABLET | ORAL | Status: DC | PRN

## 2020-09-26 MED ORDER — IBUPROFEN 800 MG PO TABS
800.0000 mg | ORAL_TABLET | Freq: Three times a day (TID) | ORAL | Status: DC
  Administered 2020-09-26 – 2020-09-28 (×6): 800 mg via ORAL
  Filled 2020-09-26 (×7): qty 1

## 2020-09-26 NOTE — Progress Notes (Signed)
MOB was referred for history of depression/anxiety. * Referral screened out by Clinical Social Worker because none of the following criteria appear to apply: ~ History of anxiety/depression during this pregnancy, or of post-partum depression following prior delivery. ~ Diagnosis of anxiety and/or depression within last 3 years OR * MOB's symptoms currently being treated with medication and/or therapy. Per further chart review, it is noted that MOB is on Zoloft for anxiety and depression.     Please contact the Clinical Social Worker if needs arise, by [facility] request, or if MOB scores greater than 9/yes to question 10 on Edinburgh Postpartum Depression Screen.   Virgie Dad Lunell Robart, MSW, LCSW [facility] [place] [phone]

## 2020-09-26 NOTE — Anesthesia Procedure Notes (Signed)
Epidural Patient location during procedure: OB Start time: 09/26/2020 3:18 AM End time: 09/26/2020 3:27 AM  Staffing Anesthesiologist: Merlinda Frederick, MD Performed: anesthesiologist   Preanesthetic Checklist Completed: patient identified, IV checked, site marked, risks and benefits discussed, monitors and equipment checked, pre-op evaluation and timeout performed  Epidural Patient position: sitting Prep: DuraPrep Patient monitoring: heart rate, cardiac monitor, continuous pulse ox and blood pressure Approach: midline Location: L2-L3 Injection technique: LOR saline  Needle:  Needle type: Tuohy  Needle gauge: 17 G Needle length: 9 cm Needle insertion depth: 6 cm Catheter type: closed end flexible Catheter size: 20 Guage Catheter at skin depth: 11 cm Test dose: negative and Other  Assessment Events: blood not aspirated, injection not painful, no injection resistance and negative IV test  Additional Notes Informed consent obtained prior to proceeding including risk of failure, 1% risk of PDPH, risk of minor discomfort and bruising.  Discussed rare but serious complications including epidural abscess, permanent nerve injury, epidural hematoma.  Discussed alternatives to epidural analgesia and patient desires to proceed.  Timeout performed pre-procedure verifying patient name, procedure, and platelet count.  Patient tolerated procedure well.

## 2020-09-26 NOTE — Progress Notes (Signed)
I received a consult for Brandy Howard related to some suicidal ideation that she had at a prenatal visit.  Her RN on Labor and Delivery stated that she was doing well and that she was not making any statements to that effect today.  As she just delivered, I am not planning to see her today, but If there is a need please feel welcome to page:  [phone].  [name], [place] Pager, [phone] 4:02 PM

## 2020-09-26 NOTE — Anesthesia Preprocedure Evaluation (Signed)
Anesthesia Evaluation  Patient identified by MRN, date of birth, ID band Patient awake    Reviewed: Allergy & Precautions, H&P , NPO status , Patient's Chart, lab work & pertinent test results  Airway Mallampati: II  TM Distance: >3 FB Neck ROM: Full    Dental no notable dental hx.    Pulmonary neg pulmonary ROS, former smoker,    Pulmonary exam normal breath sounds clear to auscultation       Cardiovascular negative cardio ROS Normal cardiovascular exam Rhythm:Regular Rate:Normal     Neuro/Psych negative neurological ROS  negative psych ROS   GI/Hepatic negative GI ROS, Neg liver ROS,   Endo/Other  negative endocrine ROS  Renal/GU negative Renal ROS  negative genitourinary   Musculoskeletal negative musculoskeletal ROS (+)   Abdominal   Peds negative pediatric ROS (+)  Hematology negative hematology ROS (+)   Anesthesia Other Findings   Reproductive/Obstetrics (+) Pregnancy                             Anesthesia Physical Anesthesia Plan  ASA: II  Anesthesia Plan: Epidural   Post-op Pain Management:    Induction:   PONV Risk Score and Plan: 2  Airway Management Planned:   Additional Equipment:   Intra-op Plan:   Post-operative Plan:   Informed Consent: I have reviewed the patients History and Physical, chart, labs and discussed the procedure including the risks, benefits and alternatives for the proposed anesthesia with the patient or authorized representative who has indicated his/her understanding and acceptance.       Plan Discussed with: Anesthesiologist  Anesthesia Plan Comments:         Anesthesia Quick Evaluation

## 2020-09-26 NOTE — Progress Notes (Signed)
Subjective:    Pt breathing w/ ctx and supported by spouse. S/P Cytotec x2 doses and Pitocin x 17 hrs w/ minimal change, MVU range 190-210 prior to break.. Pt agreed to a 2 hr break from Pitocin, restart in 2 hours, light meal and rest. Pt requesting epidural after Pitocin break.   Objective:    VS: BP 133/83   Pulse 82   Temp 97.7 F (36.5 C) (Oral)   Resp 18   Ht 5\' 3"  (1.6 m)   Wt 78.5 kg   LMP 01/02/2020   BMI 30.65 kg/m  FHR : baseline 135 / variability moderate / accelerations present / none decelerations Toco: contractions every 5 minutes / MVU 75 Membranes: AROM since 1804, fluid remains clear and w/o odor Dilation: 5 Effacement (%): 80 Cervical Position: Middle Station: -1 Presentation: Vertex Exam by:: Burman Foster CNM Pitocin : restart at 2 mU/min  Assessment/Plan:   25 y.o. G1P0000 [redacted]w[redacted]d IOL for 6/8 BPP in setting of polyhydramnios and echogenic material within amniotic fluid.6/8 BPP in setting of polyhydramnios and echogenic material within amniotic fluid.  Labor: S/P Cytotec x2, Pitocin x 17 hr w/ 2 hr break. Restarting Pitocin now.  Preeclampsia:  no signs or symptoms of toxicity Fetal Wellbeing:  Category I Pain Control:  requesting epidural I/D:  GBS pos, adequate PCN treatment Anticipated MOD:  NSVD  Arrie Eastern MSN, CNM 09/26/2020 2:27 AM

## 2020-09-26 NOTE — Progress Notes (Signed)
OB Progress Note  S: Patient comfortable with epidural.  Had Pitocin break and light meal overnight.  O: BP 126/71   Pulse 89   Temp 98 F (36.7 C) (Oral)   Resp 18   Ht 5\' 3"  (1.6 m)   Wt 78.5 kg   LMP 01/02/2020   BMI 30.65 kg/m   FHT: 140bpm, moderate variablity, + accels, - decels Toco: q3-4 minutes SVE: 5/90/-1 at ~0600 by primary RN  A/P: 25 y.o. G1P0000  @ [redacted]w[redacted]d admitted for IOL - 6/8 BPP in setting of polyhydramnios and echogenic material within amniotic fluid.  FWB: Cat. I Labor course: S/p Cytotec x 2, s/p AROM, s/p Pitocin break now Pit at 87mU/min Pain: Epidural in place GBS: Positive - receiving PCN Anticipate SVD  [name], DO [phone] (office)

## 2020-09-26 NOTE — Lactation Note (Signed)
This note was copied from a baby's chart. Lactation Consultation Note  Patient Name: Brandy Howard [id number] Date: 09/26/2020 Reason for consult: Follow-up assessment;1st time breastfeeding;Early term 37-38.6wks Age:26 hours P1, ETI female infant did not latch in L&D. Mom did not have any PN breast feeding classes nor does she have breast pump at home. Mom has flat nipples with edema, mom removed bar piercing from her left breast LC gave mom a container to place bar piercing in.  Mom attempted to latch infant on her right breast using the football hold position, LC asked mom pre-pump breast with hand pump prior to latching infant,  infant only held breast in mouth but wasn't suckling on breast nor gloved finger of LC was tongue thrusting. LC gave mom breast shells to wear in bra that was made, mom understands breast shells will help with nipple edema and evert flat nipples out more. LC gently assisted mom with hand expression with mom's permission and infant was given 4 mls of colostrum by spoon. Mom knows not to wears breast shells while sleeping or at night. Mom will continue to work towards latching infant at the breast and will ask RN or [place] for assistance with latch if needed. Mom in a lot of discomfort from abdominal pain, RN with give meds to help with pain.  Mom understands to BF infant according to cues, 8 to 12+ times within 24 hours, STS. Mom will hand express and give infant back volume if infant is not latching at the breast.  LC discussed infant's  input and output with parents.  Mom made aware of O/P services, breastfeeding support groups, community resources, and our phone # for post-discharge questions.  Mom's current plan: 1- Mom will pre-pump breast prior to latching infant with hand pump and BF infant according to hunger cues, 8-12+ times within 24 hours. 2- Mom will ask for assistance with latch if needed from RN or LC. 3-If infant is not latching mom with hand express  or use hand pump and give infant back any EBM by spoon. 4-Mom will wear breast shells in bra that was given by LC.  Maternal Data Formula Feeding for Exclusion: Yes Reason for exclusion: Mother's choice to formula and breast feed on admission Has patient been taught Hand Expression?: Yes Does the patient have breastfeeding experience prior to this delivery?: No  Feeding Feeding Type: Breast Fed  LATCH Score Latch: Too sleepy or reluctant, no latch achieved, no sucking elicited.  Audible Swallowing: None  Type of Nipple: Flat  Comfort (Breast/Nipple): Soft / non-tender  Hold (Positioning): Assistance needed to correctly position infant at breast and maintain latch.  LATCH Score: 4  Interventions Interventions: Breast feeding basics reviewed;Assisted with latch;Breast compression;Skin to skin;Adjust position;Breast massage;Support pillows;Hand express;Position options;Pre-pump if needed;Expressed milk;Hand pump;Shells  Lactation Tools Discussed/Used Tools: Shells;Pump Shell Type: Other (comment) (flat with edema) Breast pump type: Manual WIC Program: No Pump Education: Setup, frequency, and cleaning;Milk Storage;Other (comment) Initiated by:: [name], [place] Date initiated:: 09/26/20   Consult Status Consult Status: Follow-up Date: 09/27/20 Follow-up type: In-patient    [name] 09/26/2020, 6:32 PM

## 2020-09-26 NOTE — Lactation Note (Signed)
This note was copied from a baby's chart. Lactation Consultation Note  Patient Name: Brandy Howard [id number] Date: 09/26/2020 Reason for consult: L&D Initial assessment Age:26 hours  L&D consult with 21 minutes old infant and P1 mother. Parents are present at time of consult. Congratulated them on their newborn. Assisted with skin to skin prone on mother's chest. Discussed STS as ideal transition for infants after birth helping with temperature, blood sugar and comfort. Talked about primal reflexes such as rooting, hands to mouth, searching for the breast among others.   No latch or hand expression assistance at this time. Explained [place] services availability during postpartum stay. Thanked family for their time.    Maternal Data Formula Feeding for Exclusion: No Does the patient have breastfeeding experience prior to this delivery?: No  Interventions Interventions: Skin to skin;Breast feeding basics reviewed  Lactation Tools Discussed/Used WIC Program: No (plans to apply)   Consult Status Consult Status: Follow-up Date: 09/26/20 Follow-up type: In-patient    Brandy Howard 09/26/2020, 1:33 PM

## 2020-09-27 LAB — CBC
HCT: 30.5 % — ABNORMAL LOW (ref 36.0–46.0)
Hemoglobin: 10.3 g/dL — ABNORMAL LOW (ref 12.0–15.0)
MCH: 27.8 pg (ref 26.0–34.0)
MCHC: 33.8 g/dL (ref 30.0–36.0)
MCV: 82.4 fL (ref 80.0–100.0)
Platelets: 167 10*3/uL (ref 150–400)
RBC: 3.7 MIL/uL — ABNORMAL LOW (ref 3.87–5.11)
RDW: 19.3 % — ABNORMAL HIGH (ref 11.5–15.5)
WBC: 12.8 10*3/uL — ABNORMAL HIGH (ref 4.0–10.5)
nRBC: 0 % (ref 0.0–0.2)

## 2020-09-27 MED ORDER — INFLUENZA VAC SPLIT QUAD 0.5 ML IM SUSY
0.5000 mL | PREFILLED_SYRINGE | INTRAMUSCULAR | Status: AC
  Administered 2020-09-27: 0.5 mL via INTRAMUSCULAR
  Filled 2020-09-27: qty 0.5

## 2020-09-27 MED ORDER — COVID-19 MRNA VACCINE (PFIZER) 30 MCG/0.3ML IM SUSP
0.3000 mL | Freq: Once | INTRAMUSCULAR | Status: AC
  Administered 2020-09-28: 0.3 mL via INTRAMUSCULAR
  Filled 2020-09-27 (×2): qty 0.3

## 2020-09-27 NOTE — Progress Notes (Signed)
CSW received consult due to score 19 on Edinburgh Depression Screen. MOB answered (1- hardly ever) to question 10 (the thought of harming myself has occurred to me).  CSW met with MOB at bedside, FOB present. CSW introduced self and asked FOB to leave the room to speak with MOB privately, FOB left the room. CSW explained reason for consult. MOB was welcoming, open, talkative, pleasant and remained engaged during assessment. CSW and MOB discussed edinburgh score 19. MOB attributed high score to stressors, financial stressors, health and becoming a first time mom. MOB spoke at length about recent stressors and financial stressors. CSW actively listened and validated MOB's feelings surrounding her experience. MOB thanked CSW for listening.CSW provided resources to address financial stressors, MOB thanked CSW. CSW and MOB discussed MOB's mental health history. MOB reported that she was diagnosed with anxiety in 2013, depression in 2016, social anxiety in 2018 and PTSD in 2021. MOB explained the situation that led to her PTSD diagnosis and spoke about triggers from that experience. CSW acknowledged and validated MOB's feelings surrounding that experience. CSW inquired about any symptoms of her mental health diagnoses, MOB reported panic attacks 2x/day. MOB reported that she was started on Zoloft a week ago and has a therapy appointment 1/25. CSW praised MOB for participating in treatment. CSW inquired about additional coping skills, MOB reported that she likes yoga, tea and having a good meal. CSW positively affirmed MOB's positive coping skills. CSW inquired about MOB answering hardly ever to having thoughts of self harm over the past seven days. MOB explained that she never wanted to harm the baby and spoke about being overwhelmed with everything. CSW acknowledged and normalized difficulties of dealing with stressors and becoming a new mom. CSW assessed for safety, MOB denied SI, HI and domestic violence. CSW asked  how MOB was feeling emotionally after having infant, MOB reported that she felt detached and that having a baby feels surreal at the current moment. MOB reported that FOB has been providing care to infant and she has been resting. CSW acknowledged that it is important for MOB to get rest. CSW asked if MOB loved infant, MOB reported that she did. MOB spoke about being a new mom and feeling like she doesn't know what to do. CSW asked if MOB was interested in parenting education supports in the community, MOB reported no. CSW spoke with MOB about utilizing supports to assist with care of infant. CSW thanked MOB for her honesty about how she was feeling. CSW inquired about MOB's support system, MOB reported that her mom, FOB and friend are supports. MOB reported that they have all items needed to care for infant including car seat, basinet and crib. MOB presented calm and was open when discussing her mental health history. MOB did not demonstrate any acute mental health signs/symptoms.   CSW provided education regarding Baby Blues vs PMADs and provided MOB with resources for mental health follow up.  CSW encouraged MOB to evaluate her mental health throughout the postpartum period with the use of the New Mom Checklist developed by Postpartum Progress as well as the [place] Postnatal Depression Scale and notify a medical professional if symptoms arise. CSW encouraged MOB to keep a close on any symptoms given that her edinburgh score was high, MOB agreed.    CSW provided review of Sudden Infant Death Syndrome (SIDS) precautions.    CSW spoke with MOB's RN who reported no concerns.   CSW identifies no further need for intervention and no barriers  to discharge at this time.   Eurika Sandy, LCSW Clinical Social Worker [facility] Cell#: [phone] 

## 2020-09-27 NOTE — Lactation Note (Signed)
This note was copied from a baby's chart. Lactation Consultation Note  Patient Name: Brandy Howard [id number] Date: 09/27/2020 Reason for consult: Mother's request Age:26 hours  LC to room for bf assistance. Demonstrated biological positioning and baby latched easily with rhythmic suckling pattern. Mom and dad pleased and able to return demonstrate. Patient was provided with the opportunity to ask questions. All concerns were addressed.  Will plan follow up visit.   LATCH Score Latch: Grasps breast easily, tongue down, lips flanged, rhythmical sucking.  Audible Swallowing: Spontaneous and intermittent  Type of Nipple: Everted at rest and after stimulation  Comfort (Breast/Nipple): Filling, red/small blisters or bruises, mild/mod discomfort  Hold (Positioning): Assistance needed to correctly position infant at breast and maintain latch.  LATCH Score: 8    Consult Status Consult Status: Follow-up Follow-up type: In-patient    Gwynne Edinger, MA IBCLC 09/27/2020, 8:36 PM

## 2020-09-27 NOTE — Progress Notes (Signed)
PPD# 1 SVD  Information for the patient's newborn:  Brandy Howard, Brandy Howard [[id number]]  female    Circumcision N/A   S:   Reports feeling good Tolerating PO fluid and solids No nausea or vomiting Bleeding is light, no clots Pain controlled with PO meds Up ad lib / ambulatory / voiding w/o difficulty Feeding: Breast    O:   VS: BP 110/73 (BP Location: Right Arm)   Pulse 91   Temp 97.8 F (36.6 C) (Oral)   Resp 18   Ht 5\' 3"  (1.6 m)   Wt 78.5 kg   LMP 01/02/2020   SpO2 100%   BMI 30.65 kg/m   LABS:  Recent Labs    09/25/20 0643 09/27/20 0344  WBC 9.2 12.8*  HGB 12.2 10.3*  PLT 191 167   Blood type: --/--/O POS (01/12 1613) Rubella: Immune (01/12 0000)                      I&O: Intake/Output      01/14 0701 01/15 0700 01/15 0701 01/16 0700   Urine (mL/kg/hr) 900 (0.5)    Blood 300    Total Output 1200    Net -1200           Physical Exam: Alert and oriented X3 Lungs: Clear and unlabored Heart: regular rate and rhythm / no mumurs Abdomen: soft, non-tender, non-distended  Fundus: firm, non-tender Perineum: non-edematous, no hemorrhoids Lochia: apprpriate Extremities: no edema, no calf pain or tenderness    A:  PPD # 1  Normal exam  P:  Routine post partum orders Anticipate D/C on  09/28/20   Arrie Eastern, MSN, CNM 09/27/2020, 11:11 AM

## 2020-09-27 NOTE — Anesthesia Postprocedure Evaluation (Signed)
Anesthesia Post Note  Patient: Brandy Howard  Procedure(s) Performed: AN AD HOC LABOR EPIDURAL     Patient location during evaluation: Mother Baby Anesthesia Type: Epidural Level of consciousness: awake and alert Pain management: pain level controlled Vital Signs Assessment: post-procedure vital signs reviewed and stable Respiratory status: spontaneous breathing, nonlabored ventilation and respiratory function stable Cardiovascular status: stable Postop Assessment: no headache, no backache and epidural receding Anesthetic complications: no   No complications documented.  Last Vitals:  Vitals:   09/27/20 0506 09/27/20 1300  BP: 110/73 120/75  Pulse: 91 88  Resp: 18 18  Temp: 36.6 C 37.1 C  SpO2: 100% 99%    Last Pain:  Vitals:   09/27/20 1510  TempSrc:   PainSc: Asleep   Pain Goal: Patients Stated Pain Goal: 4 (09/25/20 1600)                 Rayvon Char

## 2020-09-28 ENCOUNTER — Encounter (HOSPITAL_COMMUNITY): Payer: Self-pay | Admitting: *Deleted

## 2020-09-28 DIAGNOSIS — O139 Gestational [pregnancy-induced] hypertension without significant proteinuria, unspecified trimester: Secondary | ICD-10-CM | POA: Diagnosis not present

## 2020-09-28 MED ORDER — LABETALOL HCL 100 MG PO TABS
100.0000 mg | ORAL_TABLET | Freq: Two times a day (BID) | ORAL | Status: DC
  Administered 2020-09-28: 100 mg via ORAL
  Filled 2020-09-28: qty 1

## 2020-09-28 MED ORDER — SERTRALINE HCL 50 MG PO TABS: 50.0000 mg | ORAL_TABLET | Freq: Every day | ORAL | 0 refills | Status: DC

## 2020-09-28 MED ORDER — LABETALOL HCL 100 MG PO TABS: 100.0000 mg | ORAL_TABLET | Freq: Two times a day (BID) | ORAL | 0 refills | Status: DC

## 2020-09-28 MED ORDER — IBUPROFEN 800 MG PO TABS: 800.0000 mg | ORAL_TABLET | Freq: Three times a day (TID) | ORAL | 0 refills | Status: DC

## 2020-09-28 MED ORDER — SERTRALINE HCL 50 MG PO TABS
50.0000 mg | ORAL_TABLET | Freq: Every day | ORAL | Status: DC
  Administered 2020-09-28: 50 mg via ORAL
  Filled 2020-09-28: qty 1

## 2020-09-28 NOTE — Discharge Summary (Signed)
[facility] pt of DR Delora Fuel, [place] [facility] Discharge Summary     Patient Name: Brandy Howard DOB: Apr 01, 1996 MRN: [id number]  Date of admission: 09/24/2020 Delivering MD: [name]  Date of delivery: 09/27/2019 Type of delivery: SVD  Newborn Data: Sex: Baby female Live born female  Birth Weight: 6 lb 15.5 oz (3161 g) APGAR: 96, 9  Newborn Delivery   Birth date/time: 09/26/2020 12:40:00 Delivery type: Vaginal, Spontaneous      Feeding: breast and bottle Infant being discharge to home with mother in stable condition.   Admitting diagnosis: Encounter for induction of labor [Z34.90] Intrauterine pregnancy: [redacted]w[redacted]d     Secondary diagnosis:  Active Problems:   Encounter for induction of labor   SVD (spontaneous vaginal delivery)   Normal postpartum course   Gestational hypertension                                Complications: None                                                              Intrapartum Procedures: spontaneous vaginal delivery and GBS prophylaxis tx 8 doses pcn  Postpartum Procedures: none Complications-Operative and Postpartum: sulcus degree perineal laceration Augmentation: AROM, Pitocin and Cytotec   History of Present Illness: [name] is a 25 y.o. female, G1P0000, who presents at [redacted]w[redacted]d weeks gestation. The patient has been followed at  [facility] and Gynecology  Her pregnancy has been complicated by:  Patient Active Problem List   Diagnosis Date Noted  . SVD (spontaneous vaginal delivery) 09/28/2020  . Normal postpartum course 09/28/2020  . Gestational hypertension 09/28/2020  . Anxiety and depression 09/24/2020  . Encounter for induction of labor 09/24/2020  . Abnormal uterine bleeding (AUB) 05/20/2017  . Herpes infection 07/03/2014  . Anemia 06/30/2014  . Symptomatic anemia 06/30/2014    Hospital course:  Induction of Labor With Vaginal Delivery   25 y.o. yo G1P0000 at [redacted]w[redacted]d was admitted to the hospital  09/24/2020 for induction of labor.  Indication for induction: presents for induction of labor for 6/8 BPP, polyhydramnios, and echogenic particles within the amniotic fluid..  Patient had an uncomplicated labor course as follows: Membrane Rupture Time/Date: 6:04 PM ,09/25/2020   Delivery Method:Vaginal, Spontaneous  Episiotomy: None  Lacerations:  Sulcus  Details of delivery can be found in separate delivery note.  Patient had a routine postpartum course. Patient is discharged home 09/28/20.  Newborn Data: Birth date:09/26/2020  Birth time:12:40 PM  Gender:Female  Living status:Living  Apgars:8 ,9  Weight:3161 g  Postpartum Day # 2 : S/P NSVD due to presents for induction of labor for 6/8 BPP, polyhydramnios, and echogenic particles within the amniotic fluid. Progressed to completed after Cytotec, AROM and pitocin, over sulcus tear with repairs, EBL of 300, hgb drop of 12.2-10.3. Patient up ad lib, denies syncope or dizziness. Reports consuming regular diet without issues and denies N/V. Patient reports 0 bowel movement + passing flatus.  Denies issues with urination and reports bleeding is "lighter."  Patient is breast and bottle feeding and reports going well.  Desires undecided, but endorses having painful menstrual cramps with menstruation and had IUD in past , helped with menses cramps  but mood was unbearable per pt, may decided PO contraception, will discuss further at 6 weeks PP visit for postpartum contraception.  Pain is being appropriately managed with use of po meds. Pt had h/o SI with depression and anxiety, pt mood stable now, denies SI/HI, started zoloft 1 week ago and denies any side effects, increased to 50mg  PO daily pt verbalized with f/u in 1 weeks with DR Delora Fuel and had 1/25 therapist appointment set up. Pt was dx with GHTN during labor, cmp, uric acid, CBC WNL, PCR on 1/13 which was clean catch was 0.59, repeat on 1/14 IO cath was 0.18, denies s/sx of preE, no meds until prior to  discharge BP was 140/90 and started on labetalol 100mg  BID PO after consulting with DR Mancel Bale and to be discharged home with 1 week BP and mood check appointment.  Physical exam  Vitals:   09/27/20 0506 09/27/20 1300 09/27/20 2135 09/28/20 0752  BP: 110/73 120/75 137/80 140/90  Pulse: 91 88 77 74  Resp: 18 18 18 18   Temp: 97.8 F (36.6 C) 98.7 F (37.1 C) 98.7 F (37.1 C) 97.9 F (36.6 C)  TempSrc: Oral Oral Oral Oral  SpO2: 100% 99% 100% 98%  Weight:      Height:       General: alert, cooperative and no distress Lochia: appropriate Uterine Fundus: firm Perineum: intact DVT Evaluation: No evidence of DVT seen on physical exam. Negative Homan's sign. No cords or calf tenderness. No significant calf/ankle edema.  Labs: Lab Results  Component Value Date   WBC 12.8 (H) 09/27/2020   HGB 10.3 (L) 09/27/2020   HCT 30.5 (L) 09/27/2020   MCV 82.4 09/27/2020   PLT 167 09/27/2020   CMP Latest Ref Rng & Units 09/25/2020  Glucose 70 - 99 mg/dL 103(H)  BUN 6 - 20 mg/dL 6  Creatinine 0.44 - 1.00 mg/dL 0.67  Sodium 135 - 145 mmol/L 135  Potassium 3.5 - 5.1 mmol/L 4.1  Chloride 98 - 111 mmol/L 107  CO2 22 - 32 mmol/L 16(L)  Calcium 8.9 - 10.3 mg/dL 8.9  Total Protein 6.5 - 8.1 g/dL 6.0(L)  Total Bilirubin 0.3 - 1.2 mg/dL 0.2(L)  Alkaline Phos 38 - 126 U/L 206(H)  AST 15 - 41 U/L 28  ALT 0 - 44 U/L 18    Date of discharge: 09/28/2020 Discharge Diagnoses: Term Pregnancy-delivered, GHTN Discharge instruction: per After Visit Summary and "Baby and Me Booklet".  After visit meds:   Activity:           unrestricted and pelvic rest Advance as tolerated. Pelvic rest for 6 weeks.  Diet:                routine Medications: PNV, Ibuprofen and tylenol and labetalol 100mg  BID  Postpartum contraception: Undecided Condition:  Pt discharge to home with baby in stable and condition GHTN: monitor BP, report s/sx, take labetalol 100mg  BID PO  Meds: Allergies as of 09/28/2020       Reactions   Dust Mite Extract Itching   Latex Swelling      Medication List    TAKE these medications   cyclobenzaprine 10 MG tablet Commonly known as: FLEXERIL Take 10 mg by mouth at bedtime.   diphenhydrAMINE 25 mg capsule Commonly known as: BENADRYL Take 25 mg by mouth 2 (two) times daily as needed for allergies.   ferrous sulfate 325 (65 FE) MG tablet Take 325 mg by mouth daily with breakfast.   ibuprofen 800 MG tablet  Commonly known as: ADVIL Take 1 tablet (800 mg total) by mouth every 8 (eight) hours.   labetalol 100 MG tablet Commonly known as: NORMODYNE Take 1 tablet (100 mg total) by mouth 2 (two) times daily.   sertraline 50 MG tablet Commonly known as: ZOLOFT Take 1 tablet (50 mg total) by mouth daily. What changed:   medication strength  how much to take   valACYclovir 1000 MG tablet Commonly known as: VALTREX Take 1,000 mg by mouth daily.       Discharge Follow Up:   Follow-up Information    Gynecology, [place] Follow up.   Specialty: Obstetrics and Gynecology Why: 1 week mood and BP check 6 week PPV  Contact information: [address] [place] [address] [phone]                [facility], NP-C, CNM 09/28/2020, 10:58 AM  Noralyn Pick, [place]

## 2020-09-28 NOTE — Lactation Note (Signed)
This note was copied from a baby's chart. Lactation Consultation Note  Patient Name: Brandy Howard [id number] Date: 09/28/2020 Reason for consult: Follow-up assessment;Hyperbilirubinemia;Primapara;1st time breastfeeding;Early term 37-38.6wks;Infant weight loss;Other (Comment) (5 % weight loss, Bili check high , serum Bili pending,) Age:26 hours  Mom and dad  eating breakfast and baby asleep in the crib.  Baby last fed at 6:30 am - formula 25 ml.  LC reviewed the doc flow sheets with mom.  LC recommended due to baby being supplemented to enhance let down, prior to latching - moist heat , breast massage, hand express, pre-pump with the hand pump and latch. Offer both breast prior to supplementing.  Serum Bili pending. LC discussed with mom if the serum  Bilirubin indicates treatment with photo therapy lights, a DEBP should be set up.  Feed with feeding cues and by 3 hours STS.  If mom / baby able to be discharged - [place] reviewed sore nipple and engorgement  Prevention and tx.  Mom already has a hand pump and with # 73 F.  LC will F/U with Bilirubin results.   Maternal Data    Feeding Feeding Type:  (baby last had formula at 06:30 am - sound asleep) Nipple Type: Slow - flow  LATCH Score                   Interventions Interventions: Breast feeding basics reviewed  Lactation Tools Discussed/Used Tools: Pump;Flanges Flange Size: 24 Breast pump type: Manual Pump Education: Milk Storage   Consult Status Consult Status: Follow-up Date: 09/28/20 Follow-up type: In-patient    [place] 09/28/2020, 8:34 AM

## 2020-09-29 ENCOUNTER — Ambulatory Visit: Payer: Self-pay

## 2020-09-29 ENCOUNTER — Other Ambulatory Visit (HOSPITAL_COMMUNITY): Payer: Medicaid Other

## 2020-09-29 NOTE — Lactation Note (Signed)
This note was copied from a baby's chart. Lactation Consultation Note  Patient Name: Brandy Howard [id number] Date: 09/29/2020 Reason for consult: Follow-up assessment Age:26 hours LC entered the room, mom not present at this time. LC was follow up with family regarding latch, pumping and supplementation, if family had any questions or concerns.  Per dad, mom is at home but will be returning  later tonight, they are hoping to be discharge this evening  They are waiting to here, they had extended stay due to infant's jaundice. LC observed that previous [place] discussed with family continue to BF infant according to cues, supplementation, pumping and   Engorgement prevention and treatment. Infant is latching better and is being supplemented with formula. Per dad, Mom  has outside breastfeeding support and  resources and will use them if needed when discharge.   Maternal Data    Feeding Feeding Type: Formula Nipple Type: Slow - flow  LATCH Score                   Interventions    Lactation Tools Discussed/Used     Consult Status      [name] 09/29/2020, 7:45 PM

## 2020-09-29 NOTE — Lactation Note (Signed)
This note was copied from a baby's chart. Lactation Consultation Note  Patient Name: Brandy Howard [id number] Date: 09/29/2020  [facility] saw mom around 0930  Per mom baby had recently been fed formula. And still on Double Photo.  LC reviewed supply and demand/ importance of consistent pumping around the  Clock 8-10 times a day both breast.  Per mom does not have a DEBP - at home. LC recommended while the baby is still on photo tx to be consistent with pumping to work on getting her milk in and then the since she only will have a hand pump, and the weight is ok, it will work.  LC discussed the potential sluggishness a baby has after the lights are D/C .  Feed with feeding cues and by 3 hours.  LC had reviewed sore nipple and engorgement prevention and tx yesterday and mom remembered the conversation.  Mom aware of the [facility] resources after discharge.    Maternal Data    Feeding    LATCH Score                   Interventions    Lactation Tools Discussed/Used     Consult Status      Jerlyn Ly Peyten Punches 09/29/2020, 3:17 PM

## 2020-09-30 ENCOUNTER — Ambulatory Visit: Payer: Self-pay

## 2020-09-30 LAB — SURGICAL PATHOLOGY

## 2020-09-30 NOTE — Lactation Note (Signed)
This note was copied from a baby's chart. Lactation Consultation Note  Patient Name: Brandy Howard [id number] Date: 09/30/2020 Reason for consult: Follow-up assessment;Primapara;1st time breastfeeding;Early term 37-38.6wks;Other (Comment) (per dad mom went home yesterday and stayed home. dad had mentioned mom had been pumping with the hand pump at home with milk coming in) Age:26 days  Baby is off the photo tx , Bilirubin 13.4.  Baby asleep in the crib and dad present.  LC had reviewed with mom yesterday , sore nipple and engorgement prevention and tx..   Maternal Data    Feeding Feeding Type:  (per dad baby last fed at 9 am 48 ml at 8 am 10 ml) Nipple Type: Slow - flow  LATCH Score                   Interventions    Lactation Tools Discussed/Used     Consult Status Consult Status: Complete Date: 09/30/20    Myer Haff 09/30/2020, 10:35 AM

## 2020-10-01 ENCOUNTER — Inpatient Hospital Stay (HOSPITAL_COMMUNITY): Payer: Medicaid Other

## 2020-10-01 ENCOUNTER — Inpatient Hospital Stay (HOSPITAL_COMMUNITY)
Admission: AD | Admit: 2020-10-01 | Payer: Medicaid Other | Source: Home / Self Care | Admitting: Obstetrics and Gynecology

## 2020-10-01 DIAGNOSIS — F32A Depression, unspecified: Secondary | ICD-10-CM

## 2021-02-18 DIAGNOSIS — F330 Major depressive disorder, recurrent, mild: Secondary | ICD-10-CM | POA: Insufficient documentation

## 2021-03-02 DIAGNOSIS — A6000 Herpesviral infection of urogenital system, unspecified: Secondary | ICD-10-CM | POA: Insufficient documentation

## 2021-09-13 DIAGNOSIS — U071 COVID-19: Secondary | ICD-10-CM

## 2021-09-13 HISTORY — DX: COVID-19: U07.1

## 2021-09-27 ENCOUNTER — Encounter: Payer: Self-pay | Admitting: Obstetrics and Gynecology

## 2021-09-27 ENCOUNTER — Encounter (HOSPITAL_COMMUNITY)
Admission: RE | Disposition: A | Discharge status: Home / Self Care | Payer: Self-pay | Source: Ambulatory Visit | Attending: Obstetrics and Gynecology

## 2021-09-27 ENCOUNTER — Ambulatory Visit (HOSPITAL_COMMUNITY)
Admission: RE | Admit: 2021-09-27 | Discharge: 2021-09-27 | Disposition: A | Discharge status: Home / Self Care | Payer: Medicaid Other | Source: Ambulatory Visit | Attending: Obstetrics and Gynecology | Admitting: Obstetrics and Gynecology

## 2021-09-27 ENCOUNTER — Ambulatory Visit (HOSPITAL_COMMUNITY): Payer: Medicaid Other | Admitting: Anesthesiology

## 2021-09-27 ENCOUNTER — Ambulatory Visit (HOSPITAL_COMMUNITY): Payer: Medicaid Other

## 2021-09-27 ENCOUNTER — Other Ambulatory Visit: Payer: Self-pay

## 2021-09-27 DIAGNOSIS — O034 Incomplete spontaneous abortion without complication: Secondary | ICD-10-CM

## 2021-09-27 DIAGNOSIS — Z87891 Personal history of nicotine dependence: Secondary | ICD-10-CM | POA: Diagnosis not present

## 2021-09-27 DIAGNOSIS — D649 Anemia, unspecified: Secondary | ICD-10-CM | POA: Insufficient documentation

## 2021-09-27 DIAGNOSIS — I10 Essential (primary) hypertension: Secondary | ICD-10-CM | POA: Diagnosis not present

## 2021-09-27 DIAGNOSIS — J45909 Unspecified asthma, uncomplicated: Secondary | ICD-10-CM | POA: Insufficient documentation

## 2021-09-27 DIAGNOSIS — O0739 Failed attempted termination of pregnancy with other complications: Secondary | ICD-10-CM | POA: Diagnosis not present

## 2021-09-27 HISTORY — PX: DILATION AND EVACUATION: SHX1459

## 2021-09-27 LAB — CBC
HCT: 25.9 % — ABNORMAL LOW (ref 36.0–46.0)
Hemoglobin: 8 g/dL — ABNORMAL LOW (ref 12.0–15.0)
MCH: 26.1 pg (ref 26.0–34.0)
MCHC: 30.9 g/dL (ref 30.0–36.0)
MCV: 84.6 fL (ref 80.0–100.0)
Platelets: 434 10*3/uL — ABNORMAL HIGH (ref 150–400)
RBC: 3.06 MIL/uL — ABNORMAL LOW (ref 3.87–5.11)
RDW: 15.1 % (ref 11.5–15.5)
WBC: 6.8 10*3/uL (ref 4.0–10.5)
nRBC: 0 % (ref 0.0–0.2)

## 2021-09-27 LAB — TYPE AND SCREEN
ABO/RH(D): O POS
Antibody Screen: NEGATIVE

## 2021-09-27 LAB — HCG, QUANTITATIVE, PREGNANCY: hCG, Beta Chain, Quant, S: 8853 m[IU]/mL — ABNORMAL HIGH (ref <5)

## 2021-09-27 SURGERY — DILATION AND EVACUATION, UTERUS
Anesthesia: General

## 2021-09-27 MED ORDER — ACETAMINOPHEN 500 MG PO TABS
ORAL_TABLET | ORAL | Status: AC
  Filled 2021-09-27: qty 2

## 2021-09-27 MED ORDER — METHYLERGONOVINE MALEATE 0.2 MG/ML IJ SOLN
INTRAMUSCULAR | Status: DC | PRN
  Administered 2021-09-27: .2 mg via INTRAMUSCULAR

## 2021-09-27 MED ORDER — ORAL CARE MOUTH RINSE: 15.0000 mL | Freq: Once | OROMUCOSAL | Status: AC

## 2021-09-27 MED ORDER — CHLORHEXIDINE GLUCONATE 0.12 % MT SOLN: 15.0000 mL | Freq: Once | OROMUCOSAL | Status: AC

## 2021-09-27 MED ORDER — PROPOFOL 10 MG/ML IV BOLUS
INTRAVENOUS | Status: DC | PRN
  Administered 2021-09-27: 50 mg via INTRAVENOUS
  Administered 2021-09-27: 150 mg via INTRAVENOUS

## 2021-09-27 MED ORDER — PROPOFOL 10 MG/ML IV BOLUS
INTRAVENOUS | Status: AC
  Filled 2021-09-27: qty 20

## 2021-09-27 MED ORDER — MEPERIDINE HCL 25 MG/ML IJ SOLN: 6.2500 mg | INTRAMUSCULAR | Status: DC | PRN

## 2021-09-27 MED ORDER — LACTATED RINGERS IV SOLN
INTRAVENOUS | Status: DC | PRN
Start: 2021-09-27 — End: 2021-09-27

## 2021-09-27 MED ORDER — ACETAMINOPHEN 500 MG PO TABS
1000.0000 mg | ORAL_TABLET | Freq: Once | ORAL | Status: AC
  Administered 2021-09-27: 1000 mg via ORAL

## 2021-09-27 MED ORDER — DOXYCYCLINE HYCLATE 100 MG PO TABS
200.0000 mg | ORAL_TABLET | ORAL | Status: AC
  Administered 2021-09-27: 200 mg via ORAL
  Filled 2021-09-27: qty 2

## 2021-09-27 MED ORDER — FENTANYL CITRATE (PF) 250 MCG/5ML IJ SOLN
INTRAMUSCULAR | Status: AC
  Filled 2021-09-27: qty 5

## 2021-09-27 MED ORDER — GLYCOPYRROLATE 0.2 MG/ML IJ SOLN
INTRAMUSCULAR | Status: DC | PRN
  Administered 2021-09-27: .2 mg via INTRAVENOUS

## 2021-09-27 MED ORDER — OXYCODONE HCL 5 MG PO TABS
5.0000 mg | ORAL_TABLET | Freq: Once | ORAL | Status: AC | PRN
  Administered 2021-09-27: 5 mg via ORAL

## 2021-09-27 MED ORDER — OXYCODONE HCL 5 MG PO TABS
ORAL_TABLET | ORAL | Status: AC
  Filled 2021-09-27: qty 1

## 2021-09-27 MED ORDER — CHLORHEXIDINE GLUCONATE 0.12 % MT SOLN
OROMUCOSAL | Status: AC
  Administered 2021-09-27: 15 mL via OROMUCOSAL
  Filled 2021-09-27: qty 15

## 2021-09-27 MED ORDER — FENTANYL CITRATE (PF) 250 MCG/5ML IJ SOLN
INTRAMUSCULAR | Status: DC | PRN
  Administered 2021-09-27 (×2): 50 ug via INTRAVENOUS

## 2021-09-27 MED ORDER — AMISULPRIDE (ANTIEMETIC) 5 MG/2ML IV SOLN: 10.0000 mg | Freq: Once | INTRAVENOUS | Status: DC | PRN

## 2021-09-27 MED ORDER — LIDOCAINE HCL (CARDIAC) PF 100 MG/5ML IV SOSY
PREFILLED_SYRINGE | INTRAVENOUS | Status: DC | PRN
Start: 2021-09-27 — End: 2021-09-27
  Administered 2021-09-27: 60 mg via INTRATRACHEAL

## 2021-09-27 MED ORDER — OXYCODONE HCL 5 MG/5ML PO SOLN: 5.0000 mg | Freq: Once | ORAL | Status: AC | PRN

## 2021-09-27 MED ORDER — MIDAZOLAM HCL 2 MG/2ML IJ SOLN
INTRAMUSCULAR | Status: AC
  Filled 2021-09-27: qty 2

## 2021-09-27 MED ORDER — HYDROMORPHONE HCL 1 MG/ML IJ SOLN: 0.2500 mg | INTRAMUSCULAR | Status: DC | PRN

## 2021-09-27 MED ORDER — 0.9 % SODIUM CHLORIDE (POUR BTL) OPTIME
TOPICAL | Status: DC | PRN
  Administered 2021-09-27: 1000 mL

## 2021-09-27 MED ORDER — SILVER NITRATE-POT NITRATE 75-25 % EX MISC
CUTANEOUS | Status: DC | PRN
  Administered 2021-09-27: 2 via TOPICAL

## 2021-09-27 MED ORDER — SCOPOLAMINE 1 MG/3DAYS TD PT72
MEDICATED_PATCH | TRANSDERMAL | Status: AC
  Filled 2021-09-27: qty 1

## 2021-09-27 MED ORDER — METHYLERGONOVINE MALEATE 0.2 MG/ML IJ SOLN
INTRAMUSCULAR | Status: AC
  Filled 2021-09-27: qty 1

## 2021-09-27 MED ORDER — DOXYCYCLINE HYCLATE 100 MG PO TABS
ORAL_TABLET | ORAL | Status: AC
  Filled 2021-09-27: qty 2

## 2021-09-27 MED ORDER — LACTATED RINGERS IV SOLN: INTRAVENOUS | Status: DC

## 2021-09-27 MED ORDER — SCOPOLAMINE 1 MG/3DAYS TD PT72
1.0000 | MEDICATED_PATCH | TRANSDERMAL | Status: DC
  Administered 2021-09-27: 1.5 mg via TRANSDERMAL

## 2021-09-27 MED ORDER — SUCCINYLCHOLINE 20MG/ML (10ML) SYRINGE FOR MEDFUSION PUMP - OPTIME
INTRAMUSCULAR | Status: DC | PRN
  Administered 2021-09-27: 40 mg via INTRAVENOUS

## 2021-09-27 MED ORDER — MIDAZOLAM HCL 2 MG/2ML IJ SOLN
INTRAMUSCULAR | Status: DC | PRN
Start: 2021-09-27 — End: 2021-09-27
  Administered 2021-09-27: 2 mg via INTRAVENOUS

## 2021-09-27 SURGICAL SUPPLY — 26 items
CATH ROBINSON RED A/P 16FR (CATHETERS) ×2 IMPLANT
CNTNR URN SCR LID CUP LEK RST (MISCELLANEOUS) ×1 IMPLANT
CONT SPEC 4OZ STRL OR WHT (MISCELLANEOUS) ×2
FILTER UTR ASPR ASSEMBLY (MISCELLANEOUS) ×2 IMPLANT
GLOVE SURG ENC MOIS LTX SZ6.5 (GLOVE) ×4 IMPLANT
GLOVE SURG ENC MOIS LTX SZ7.5 (GLOVE) ×2 IMPLANT
GLOVE SURG UNDER POLY LF SZ6.5 (GLOVE) ×2 IMPLANT
GLOVE SURG UNDER POLY LF SZ7 (GLOVE) ×4 IMPLANT
GLOVE SURG UNDER POLY LF SZ7.5 (GLOVE) ×4 IMPLANT
GOWN STRL REUS W/ TWL LRG LVL3 (GOWN DISPOSABLE) ×5 IMPLANT
GOWN STRL REUS W/TWL LRG LVL3 (GOWN DISPOSABLE) ×10
HOSE CONNECTING 18IN BERKELEY (TUBING) ×2 IMPLANT
KIT BERKELEY 1ST TRI 3/8 NO TR (MISCELLANEOUS) ×2 IMPLANT
KIT BERKELEY 2ND TRIMESTER 1/2 (COLLECTOR) ×2 IMPLANT
KIT TURNOVER KIT B (KITS) ×2 IMPLANT
NS IRRIG 1000ML POUR BTL (IV SOLUTION) ×2 IMPLANT
PACK VAGINAL MINOR WOMEN LF (CUSTOM PROCEDURE TRAY) ×4 IMPLANT
PAD OB MATERNITY 4.3X12.25 (PERSONAL CARE ITEMS) ×4 IMPLANT
SPECIMEN JAR MEDIUM (MISCELLANEOUS) ×2 IMPLANT
TOWEL GREEN STERILE FF (TOWEL DISPOSABLE) ×8 IMPLANT
TUBE VACURETTE 2ND TRIMESTER (CANNULA) ×2 IMPLANT
UNDERPAD 30X36 HEAVY ABSORB (UNDERPADS AND DIAPERS) ×4 IMPLANT
VACURETTE 12 RIGID CVD (CANNULA) IMPLANT
VACURETTE 14MM CVD 1/2 BASE (CANNULA) IMPLANT
VACURETTE 16MM ASPIR CVD .5 (CANNULA) IMPLANT
VACURETTE 8 RIGID CVD (CANNULA) ×1 IMPLANT

## 2021-09-27 NOTE — Transfer of Care (Signed)
Immediate Anesthesia Transfer of Care Note  Patient: Brandy Howard  Procedure(s) Performed: DILATATION AND EVACUATION UNDER ULTRASOUND GUIDANCE  Patient Location: PACU  Anesthesia Type:General  Level of Consciousness: awake, oriented, drowsy, patient cooperative and responds to stimulation  Airway & Oxygen Therapy: Patient Spontanous Breathing and Patient connected to nasal cannula oxygen  Post-op Assessment: Report given to RN, Post -op Vital signs reviewed and stable and Patient moving all extremities X 4  Post vital signs: Reviewed and stable  Last Vitals:  Vitals Value Taken Time  BP    Temp    Pulse    Resp    SpO2      Last Pain:  Vitals:   09/27/21 1257  TempSrc: Oral  PainSc:       Patients Stated Pain Goal: 3 (78/93/81 0175)  Complications: No notable events documented.

## 2021-09-27 NOTE — Interval H&P Note (Signed)
History and Physical Interval Note:  09/27/2021 1:42 PM  Brandy Howard  has presented today for surgery, with the diagnosis of Abdominal Pain.  The various methods of treatment have been discussed with the patient and family. After consideration of risks, benefits and other options for treatment, the patient has consented to  Procedure(s): DILATATION AND CURETTAGE (N/A) as a surgical intervention.  The patient's history has been reviewed, patient examined, no change in status, stable for surgery.  I have reviewed the patient's chart and labs.  Questions were answered to the patient's satisfaction.  Patient does not desire me to call any family members after surgery. In the event of emergency, desires her mother to be called.   [name]

## 2021-09-27 NOTE — H&P (Signed)
Brandy Howard is an 26 y.o. [id number] who is admitted for Dilation and Suction curettage for retained products of conception after medical abortion on 09/16/21.  She reports that she was given medication on 09/16/21 for medical abortion - passed products and have heavy blood clots. She reports significant cramping and feeling like something will expelled from the vagina. Her husband is unaware of her abortion.  Patient presented to [facility] ED in [place] for evaluation (notes visible on Care Everywhere). Suspected retained products of conception noted on [place] (as documented below). Hemoglobin on evaluation was 8.3 and repeat was 7.8. She was discharged home in stable condition with plan for outpatient follow-up. Surgical vs repeat medical management offered - patient opted for surgical management given Hemoglobin level.  Patient was discharged home with Tramadol 50mg  q6h PRN for up to 3 days, Zofran 4mg  q8h PRN x 7 days, and Ferrous sulfate 325mg  BID x 10 days upon discharge from ED last night.  Patient last drank a glass of milk at 0800 today.  CBC (09/26/21):  WBC 8.2   Hgb 8.3   Hct 25.6   Plt 422  Repeat H/H: 7.8/24.6  TVUS (09/26/2021): Brandy Chris, MD - 09/26/2021   Formatting of this note might be different from the original. Pelvic ultrasound.  HISTORY: Pelvic pain, vaginal bleeding. Status post recent medical termination of pregnancy  Real-time grayscale and color Doppler transabdominal transvaginal imaging of the pelvis was performed.  Spectral analysis of the ovaries was performed.  The uterus measures 8.5 x 5.2 x 6.3 cm. There is a 2.2 cm heterogeneous endometrial stripe demonstrating internal vascularity.  The right ovary measures 3.3 x 1.7 x 2.0 cm and demonstrates arterial and venous flow.  The left ovary measures 2.9 x 1.3 x 2.1 cm and demonstrates arterial and venous flow.  There is no pelvic free fluid.   IMPRESSION:  Thickened heterogeneous  endometrial stripe demonstrating internal vascularity and the possibility of retained products of conception cannot be excluded.  No evidence of ovarian torsion.  No pelvic free fluid.  Patient Active Problem List   Diagnosis Date Noted   SVD (spontaneous vaginal delivery) 09/28/2020   Normal postpartum course 09/28/2020   Gestational hypertension 09/28/2020   Anxiety and depression 09/24/2020   Encounter for induction of labor 09/24/2020   Abnormal uterine bleeding (AUB) 05/20/2017   Herpes infection 07/03/2014   Anemia 06/30/2014   Symptomatic anemia 06/30/2014    MEDICAL/FAMILY/SOCIAL HX: No LMP recorded.    Past Medical History:  Diagnosis Date   Anemia    Anxiety    Asthma    last used inhaler in 2015   Depression    doing ok   Eczema    Genital herpes    Seasonal allergies     Past Surgical History:  Procedure Laterality Date   NO PAST SURGERIES      Family History  Adopted: Yes  Problem Relation Age of Onset   Hypertension Father    Hypertension Mother     Social History:  reports that she has quit smoking. Her smoking use included cigars. She has never used smokeless tobacco. She reports current alcohol use. She reports that she does not use drugs.  ALLERGIES/MEDS:  Allergies:  Allergies  Allergen Reactions   Dust Mite Extract Itching   Latex Swelling    No medications prior to admission.     Review of Systems  Constitutional: Negative.   HENT: Negative.    Eyes: Negative.   Respiratory:  Negative.    Cardiovascular: Negative.  Negative for chest pain.  Gastrointestinal:  Positive for abdominal pain.  Musculoskeletal: Negative.   Skin: Negative.   Neurological: Negative.   Endo/Heme/Allergies: Negative.   Psychiatric/Behavioral: Negative.     unknown if currently breastfeeding. Gen:  NAD, pleasant and cooperative Cardio:  RRR Pulm:  CTAB, no wheezes/rales/rhonchi Abd:  Soft, non-distended, non-tender throughout, no  rebound/guarding, mild tenderness of uterus Ext:  No bilateral LE edema, no bilateral calf tenderness  No results found for this or any previous visit (from the past 24 hour(s)).  No results found.   ASSESSMENT/PLAN: Brandy Howard is a 26 y.o. G2P1011 who is admitted for Dilation and Suction curettage for retained products of conception after medical abortion on 09/16/21.  - Admit to outpatient surgery - Admit labs (CBC, quant HCG, T&S, COVID screen) - Diet:  NPO - IVF:  Per anesthesia - VTE Prophylaxis:  SCDs - Antibiotics: Doxycycline 100mg  on call to OR - Anticipate D/C home same day  Consents: I discussed with the patient that this surgery is performed remove the retained products of conception.  Prior to surgery, the risks and benefits of the surgery, as well as alternative treatments, have been discussed.  The risks include, but are not limited to bleeding, including the need for a blood transfusion, infection, damage to organs and tissues, including uterine perforation, requiring additional surgery, postoperative pain, short-term and long-term, failure of the procedure to control symptoms, need for hysterectomy to control bleeding, inability to safely complete the procedure, deep vein thrombosis and/or pulmonary embolism, painful intercourse, complications the course of which cannot be predicted or prevented, and death.  Patient was consented for blood products.  The patient is aware that bleeding may result in the need for a blood transfusion which includes risk of transmission of HIV (1:2 million), Hepatitis C (1:2 million), and Hepatitis B (1:200 thousand) and transfusion reaction.  Patient voiced understanding of the above risks as well as understanding of indications for blood transfusion.  [name], DO

## 2021-09-27 NOTE — Anesthesia Procedure Notes (Signed)
Procedure Name: Intubation Date/Time: 09/27/2021 2:09 PM Performed by: Claris Che, CRNA Pre-anesthesia Checklist: Patient identified, Emergency Drugs available, Suction available, Patient being monitored and Timeout performed Patient Re-evaluated:Patient Re-evaluated prior to induction Oxygen Delivery Method: Circle system utilized Preoxygenation: Pre-oxygenation with 100% oxygen Induction Type: Rapid sequence and Cricoid Pressure applied Laryngoscope Size: Mac and 4 Grade View: Grade I Tube type: Oral Tube size: 7.5 mm Number of attempts: 1 Airway Equipment and Method: Stylet Placement Confirmation: ETT inserted through vocal cords under direct vision, positive ETCO2 and breath sounds checked- equal and bilateral Secured at: 22 cm Tube secured with: Tape Dental Injury: Teeth and Oropharynx as per pre-operative assessment

## 2021-09-27 NOTE — Anesthesia Postprocedure Evaluation (Signed)
Anesthesia Post Note  Patient: Brandy Howard  Procedure(s) Performed: DILATATION AND EVACUATION UNDER ULTRASOUND GUIDANCE     Patient location during evaluation: PACU Anesthesia Type: General Level of consciousness: sedated and patient cooperative Pain management: pain level controlled Vital Signs Assessment: post-procedure vital signs reviewed and stable Respiratory status: spontaneous breathing Cardiovascular status: stable Anesthetic complications: no   No notable events documented.  Last Vitals:  Vitals:   09/27/21 1530 09/27/21 1545  BP:  112/80  Pulse: 88 83  Resp: 16 20  Temp:  37 C  SpO2: 100% 100%    Last Pain:  Vitals:   09/27/21 1447  TempSrc:   PainSc: 0-No pain                 Nolon Nations

## 2021-09-27 NOTE — Op Note (Signed)
Pre Op Dx:   1. Retained products of conception after medical abortion  Post Op Dx:   Same as pre-operative diagnosis  Procedure:   Dilation and suction curettage under ultrasound guidance   Surgeon:  Dr. [name] Assistants:  None Anesthesia:  General   EBL:  40cc  IVF:  700cc UOP:  50cc via in-and-out catheter   Drains:  In-and-out foley catheter Specimen removed:  Products of conception - sent to pathology Device(s) implanted: None Case Type:  Clean-contaminated Findings:  Thickened uterine stripe on ultrasound. Complications: None Indications:  26 y.o. G2P1011 s/p medical abortion on 1/4 with cramping and abdominal pain. Thickened endometrium visualized at outside hospital on ultrasound consistent with retained products of conception.  Description of each procedure:  After informed consent was obtained the patient was taken to the operating room in the dorsal supine position.  After administration of general anesthesia, the patient was placed in the dorsal lithotomy position and prepped and draped in the usual sterile fashion.  Her bladder was emptied using an in and out catheter.  A pre-operative time-out was completed.  The anterior lip of the cervix was grasped with a single-tooth tenaculum and the cervix was serially dilated to accommodate an 57mm suction cannula. The cannula was gently advanced to the fundus, then withdrawn under suction in a spiral fashion with return of products of conception. Multiple passes were required. Once the contents of the uterus were evacuated, the uterus was sharply curetted and a gritty texture noted throughout. Hemostasis adequate. Methergine 0.20mg  IM was administered intraoperatively. Complete uterine evacuation confirmed with thin uterine stripe noted on ultrasound.The single-tooth tenaculum was removed and its sites were made hemostatic using silver nitrate.  Adequate hemostasis was noted.  The patient was awakened and extubated and appeared  to have tolerated the procedure well.  All counts were correct.  Disposition:  PACU  [name], DO

## 2021-09-27 NOTE — Anesthesia Preprocedure Evaluation (Addendum)
Anesthesia Evaluation  Patient identified by MRN, date of birth, ID band Patient awake    Reviewed: Allergy & Precautions, H&P , NPO status , Patient's Chart, lab work & pertinent test results  Airway Mallampati: I  TM Distance: >3 FB Neck ROM: Full    Dental no notable dental hx. (+) Dental Advisory Given, Teeth Intact   Pulmonary asthma , former smoker,    Pulmonary exam normal breath sounds clear to auscultation       Cardiovascular hypertension, Normal cardiovascular exam Rhythm:Regular Rate:Normal     Neuro/Psych PSYCHIATRIC DISORDERS Anxiety Depression negative neurological ROS     GI/Hepatic negative GI ROS, Neg liver ROS,   Endo/Other  negative endocrine ROS  Renal/GU negative Renal ROS     Musculoskeletal negative musculoskeletal ROS (+)   Abdominal   Peds  Hematology negative hematology ROS (+) anemia ,   Anesthesia Other Findings   Reproductive/Obstetrics negative OB ROS                            Anesthesia Physical Anesthesia Plan  ASA: 2  Anesthesia Plan: General   Post-op Pain Management: Minimal or no pain anticipated, Tylenol PO (pre-op) and Toradol IV (intra-op)   Induction: Intravenous and Rapid sequence  PONV Risk Score and Plan: 4 or greater and Treatment may vary due to age or medical condition, Midazolam, Ondansetron, Dexamethasone and Scopolamine patch - Pre-op  Airway Management Planned: Oral ETT  Additional Equipment: None  Intra-op Plan:   Post-operative Plan: Extubation in OR  Informed Consent: I have reviewed the patients History and Physical, chart, labs and discussed the procedure including the risks, benefits and alternatives for the proposed anesthesia with the patient or authorized representative who has indicated his/her understanding and acceptance.     Dental advisory given  Plan Discussed with: CRNA  Anesthesia Plan Comments:         Anesthesia Quick Evaluation

## 2021-09-28 ENCOUNTER — Encounter (HOSPITAL_COMMUNITY): Payer: Self-pay | Admitting: Obstetrics and Gynecology

## 2021-09-29 LAB — SURGICAL PATHOLOGY
